# Patient Record
Sex: Female | Born: 1945 | Race: White | Hispanic: No | Marital: Single | Smoking: Never smoker
Health system: Southern US, Community
[De-identification: ages and names within clinical notes are randomized; demographics above are authoritative.]

## PROBLEM LIST (undated history)

## (undated) DIAGNOSIS — C801 Malignant (primary) neoplasm, unspecified: Secondary | ICD-10-CM

## (undated) DIAGNOSIS — Z789 Other specified health status: Secondary | ICD-10-CM

## (undated) DIAGNOSIS — N189 Chronic kidney disease, unspecified: Secondary | ICD-10-CM

## (undated) DIAGNOSIS — I493 Ventricular premature depolarization: Secondary | ICD-10-CM

## (undated) DIAGNOSIS — R112 Nausea with vomiting, unspecified: Secondary | ICD-10-CM

## (undated) DIAGNOSIS — I10 Essential (primary) hypertension: Secondary | ICD-10-CM

## (undated) DIAGNOSIS — M1712 Unilateral primary osteoarthritis, left knee: Secondary | ICD-10-CM

## (undated) DIAGNOSIS — I499 Cardiac arrhythmia, unspecified: Secondary | ICD-10-CM

## (undated) DIAGNOSIS — K219 Gastro-esophageal reflux disease without esophagitis: Secondary | ICD-10-CM

## (undated) DIAGNOSIS — J449 Chronic obstructive pulmonary disease, unspecified: Secondary | ICD-10-CM

## (undated) DIAGNOSIS — J189 Pneumonia, unspecified organism: Secondary | ICD-10-CM

## (undated) HISTORY — PX: CARPAL TUNNEL RELEASE: SHX101

## (undated) HISTORY — DX: Chronic kidney disease, unspecified: N18.9

## (undated) HISTORY — PX: CYSTOSCOPY: SHX5120

## (undated) HISTORY — PX: WRIST ARTHROSCOPY: SUR100

## (undated) HISTORY — DX: Nausea with vomiting, unspecified: R11.2

## (undated) HISTORY — PX: HEMORROIDECTOMY: SUR656

## (undated) HISTORY — PX: ULNAR NERVE REPAIR: SHX2594

## (undated) HISTORY — DX: Essential (primary) hypertension: I10

## (undated) HISTORY — PX: CHOLECYSTECTOMY: SHX55

## (undated) HISTORY — PX: INGUINAL HERNIA REPAIR: SHX194

## (undated) HISTORY — PX: ARCUATE KERATECTOMY: SHX212

## (undated) HISTORY — PX: EYE SURGERY: SHX253

## (undated) HISTORY — PX: JOINT REPLACEMENT: SHX530

## (undated) HISTORY — DX: Other specified health status: Z78.9

## (undated) HISTORY — PX: ABDOMINAL HYSTERECTOMY: SHX81

## (undated) HISTORY — PX: CARDIAC CATHETERIZATION: SHX172

## (undated) HISTORY — DX: Cardiac arrhythmia, unspecified: I49.9

## (undated) HISTORY — DX: Pneumonia, unspecified organism: J18.9

## (undated) HISTORY — PX: BREAST BIOPSY: SHX20

---

## 2010-08-25 ENCOUNTER — Inpatient Hospital Stay (HOSPITAL_COMMUNITY): Admission: RE | Admit: 2010-08-25 | Discharge: 2010-08-28 | Payer: Self-pay | Admitting: Orthopaedic Surgery

## 2011-02-08 LAB — BASIC METABOLIC PANEL
BUN: 11 mg/dL (ref 6–23)
CO2: 30 mEq/L (ref 19–32)
CO2: 31 mEq/L (ref 19–32)
CO2: 33 mEq/L — ABNORMAL HIGH (ref 19–32)
CO2: 30 mEq/L (ref 19–32)
Calcium: 8.3 mg/dL — ABNORMAL LOW (ref 8.4–10.5)
Calcium: 8.5 mg/dL (ref 8.4–10.5)
Calcium: 8.8 mg/dL (ref 8.4–10.5)
Calcium: 9.5 mg/dL (ref 8.4–10.5)
Creatinine, Ser: 0.71 mg/dL (ref 0.4–1.2)
Creatinine, Ser: 0.74 mg/dL (ref 0.4–1.2)
GFR calc Af Amer: >60 mL/min (ref >60)
GFR calc Af Amer: >60 mL/min (ref >60)
GFR calc non Af Amer: >60 mL/min (ref >60)
Glucose, Bld: 172 mg/dL — ABNORMAL HIGH (ref 70–99)
Glucose, Bld: 183 mg/dL — ABNORMAL HIGH (ref 70–99)
Glucose, Bld: 214 mg/dL — ABNORMAL HIGH (ref 70–99)
Sodium: 138 mEq/L (ref 135–145)
Sodium: 140 mEq/L (ref 135–145)
Sodium: 142 mEq/L (ref 135–145)

## 2011-02-08 LAB — CBC
Hemoglobin: 10.5 g/dL — ABNORMAL LOW (ref 12.0–15.0)
Hemoglobin: 10.7 g/dL — ABNORMAL LOW (ref 12.0–15.0)
Hemoglobin: 13.3 g/dL (ref 12.0–15.0)
MCH: 29.4 pg (ref 26.0–34.0)
MCH: 29.9 pg (ref 26.0–34.0)
MCH: 29.9 pg (ref 26.0–34.0)
MCH: 29.4 pg (ref 26.0–34.0)
MCHC: 34 g/dL (ref 30.0–36.0)
MCHC: 34.5 g/dL (ref 30.0–36.0)
MCHC: 34.9 g/dL (ref 30.0–36.0)
Platelets: 267 10*3/uL (ref 150–400)
RBC: 4.51 MIL/uL (ref 3.87–5.11)
RDW: 13.8 % (ref 11.5–15.5)
WBC: 6.8 10*3/uL (ref 4.0–10.5)

## 2011-02-08 LAB — GLUCOSE, CAPILLARY
Glucose-Capillary: 133 mg/dL — ABNORMAL HIGH (ref 70–99)
Glucose-Capillary: 153 mg/dL — ABNORMAL HIGH (ref 70–99)
Glucose-Capillary: 155 mg/dL — ABNORMAL HIGH (ref 70–99)
Glucose-Capillary: 162 mg/dL — ABNORMAL HIGH (ref 70–99)
Glucose-Capillary: 165 mg/dL — ABNORMAL HIGH (ref 70–99)

## 2011-02-08 LAB — PROTIME-INR
INR: 1.03 (ref 0.00–1.49)
Prothrombin Time: 14.4 seconds (ref 11.6–15.2)
Prothrombin Time: 15.7 seconds — ABNORMAL HIGH (ref 11.6–15.2)
Prothrombin Time: 16.9 seconds — ABNORMAL HIGH (ref 11.6–15.2)

## 2011-02-08 LAB — URINALYSIS, ROUTINE W REFLEX MICROSCOPIC
Glucose, UA: NEGATIVE mg/dL
Specific Gravity, Urine: 1.013 (ref 1.005–1.030)
pH: 6.5 (ref 5.0–8.0)

## 2011-09-26 ENCOUNTER — Other Ambulatory Visit: Payer: Self-pay | Admitting: Orthopaedic Surgery

## 2011-09-26 ENCOUNTER — Encounter (HOSPITAL_COMMUNITY): Payer: 59

## 2011-09-26 ENCOUNTER — Ambulatory Visit (HOSPITAL_COMMUNITY)
Admission: RE | Admit: 2011-09-26 | Discharge: 2011-09-26 | Disposition: A | Discharge status: Home / Self Care | Payer: 59 | Source: Ambulatory Visit | Attending: Orthopaedic Surgery | Admitting: Orthopaedic Surgery

## 2011-09-26 ENCOUNTER — Other Ambulatory Visit (HOSPITAL_COMMUNITY): Payer: Self-pay | Admitting: Orthopaedic Surgery

## 2011-09-26 ENCOUNTER — Encounter (HOSPITAL_COMMUNITY): Payer: Self-pay

## 2011-09-26 DIAGNOSIS — Z01818 Encounter for other preprocedural examination: Secondary | ICD-10-CM | POA: Insufficient documentation

## 2011-09-26 DIAGNOSIS — Z01811 Encounter for preprocedural respiratory examination: Secondary | ICD-10-CM

## 2011-09-26 DIAGNOSIS — I10 Essential (primary) hypertension: Secondary | ICD-10-CM | POA: Insufficient documentation

## 2011-09-26 DIAGNOSIS — Z01812 Encounter for preprocedural laboratory examination: Secondary | ICD-10-CM | POA: Insufficient documentation

## 2011-09-26 HISTORY — PX: OTHER SURGICAL HISTORY: SHX169

## 2011-09-26 LAB — URINALYSIS, ROUTINE W REFLEX MICROSCOPIC
Glucose, UA: NEGATIVE mg/dL
Specific Gravity, Urine: 1.018 (ref 1.005–1.030)
pH: 5.5 (ref 5.0–8.0)

## 2011-09-26 LAB — CBC
Hemoglobin: 12.9 g/dL (ref 12.0–15.0)
MCH: 27.9 pg (ref 26.0–34.0)
MCV: 84.4 fL (ref 78.0–100.0)
RBC: 4.63 MIL/uL (ref 3.87–5.11)

## 2011-09-26 LAB — URINE MICROSCOPIC-ADD ON

## 2011-09-26 LAB — BASIC METABOLIC PANEL
CO2: 28 mEq/L (ref 19–32)
Calcium: 10.3 mg/dL (ref 8.4–10.5)
Creatinine, Ser: 0.68 mg/dL (ref 0.50–1.10)
GFR calc Af Amer: >90 mL/min (ref >90)

## 2011-09-26 LAB — SURGICAL PCR SCREEN: MRSA, PCR: NEGATIVE

## 2011-09-26 NOTE — Pre-Procedure Instructions (Addendum)
INST TO [facility] AT SHORT STAY AT 6:30AM ON 10/05/11 NPO AFTER MIDNIGHT -  SHOWER WITH BETASEPT PM AND AM OF SURG  BRING INCENTIVE SPIROMETER TO HOSP  BRING C-PAP MASK 

## 2011-09-27 ENCOUNTER — Encounter (HOSPITAL_COMMUNITY): Payer: Self-pay

## 2011-09-27 NOTE — Pre-Procedure Instructions (Signed)
[facility] TO [facility] AT SHORT STAY AT 6:30AM ON 10/05/11 NPO AFTER MIDNIGHT -  SHOWER WITH BETASEPT PM AND AM OF SURG  BRING INCENTIVE SPIROMETER TO HOSP  BRING C-PAP MASK 

## 2011-09-27 NOTE — Pre-Procedure Instructions (Signed)
INST TO [facility] AT SHORT STAY AT 6:30AM ON 10/05/11 NPO AFTER MIDNIGHT -  SHOWER WITH BETASEPT PM AND AM OF SURG  BRING INCENTIVE SPIROMETER TO HOSP  BRING C-PAP MASK

## 2011-09-27 NOTE — Patient Instructions (Signed)
20 Arwyn Besaw  09/27/2011   Your procedure is scheduled on: 10/05/11 Report to [facility] at 06:30A AM.  Call this number if you have problems the morning of surgery: [phone]   Remember:   Do not eat food:After Midnight.  Do not drink clear liquids: After Midnight.  Take these medicines the morning of surgery with A SIP OF WATER:NONE   Do not wear jewelry, make-up or nail polish.  Do not wear lotions, powders, or perfumes. You may wear deodorant.  Do not shave 48 hours prior to surgery.  Do not bring valuables to the hospital.  Contacts, dentures or bridgework may not be worn into surgery.  Leave suitcase in the car. After surgery it may be brought to your room.  For patients admitted to the hospital, checkout time is 11:00 AM the day of discharge.   Patients discharged the day of surgery will not be allowed to drive home.  Name and phone number of your driver:  Special Instructions: Incentive Spirometry - Practice and bring it with you on the day of surgery.   Please read over the following fact sheets that you were given: Blood Transfusion Information, MRSA Information and Surgical Site Infection Prevention

## 2011-10-02 NOTE — H&P (Signed)
Lindsey Williams, Lindsey Williams                  ACCOUNT NO.:  [id number]  MEDICAL RECORD NO.:  [id number]  LOCATION:                                 FACILITY:  PHYSICIAN:  Vanita Panda. Magnus Ivan, M.D.DATE OF BIRTH:  1945/12/03  DATE OF ADMISSION:  10/05/2011 DATE OF DISCHARGE:                             HISTORY & PHYSICAL   CHIEF COMPLAINT:  Severe left knee pain.  HISTORY OF PRESENT ILLNESS:  Ms. Pence is a patient that is well known to me.  She has well documented severe osteoarthritis of her left knee. This knee has been hurting quite a bit for the last year.  She is actually status post right total knee arthroplasty around a year ago. The right knee has been doing well, but the left knee has gotten to where it is affecting her activities of daily living including walking. She cannot walk any significant distances without assistive device and with well documented evidence of bone on bone wear.  She wished to proceed at this point with a total knee arthroplasty.  She has even had to modify her work in terms of how she sits.  She has tried multiple steroid injections in her left knee as well as anti-inflammatories and rest.  At this point, we are going to proceed with a total knee arthroplasty of the left knee.  She understands the risks of acute blood loss anemia, nerve and vessel injury, DVT and PE as well as even death. She understands the benefits of the improved quality of life and decrease pain and that is what our goal of surgery would be is to give her reasonably improved quality of life and decrease the pain.  She is scheduled for surgery on Friday, October 05, 2011.  PAST MEDICAL HISTORY: 1. High blood pressure. 2. Heart disease. 3. Osteoarthritis. 4. Bladder problems. 5. Sleep apnea. 6. Remote history of cancer.  MEDICATIONS: 1. Tylenol. 2. Aspirin. 3. Vitamin B complex with vitamin C. 4. Os-Cal with vitamin D. 5. Celebrex. 6. Metoprolol. 7.  Diovan/hydrochlorothiazide. 8. We will review her home medications and home medical reconciliation     orders on the day of surgery.  ALLERGIES:  ERYTHROMYCIN.  FAMILY MEDICAL HISTORY:  Diabetes, heart disease, kidney and bladder cancer, and high blood pressure.  SOCIAL HISTORY:  She works as a Holiday representative.  She does not smoke and does not drink regularly.  REVIEW OF SYSTEMS:  Negative for chest pain, shortness of breath, fever, chills, nausea, or vomiting.  Positive for left knee pain.  PHYSICAL EXAMINATION:  VITAL SIGNS:  She is afebrile with stable vital signs and her updated vitals signs will be seen and recorded on the day of surgery. GENERAL:  She is alert and oriented x3, in no acute distress. HEENT:  Normocephalic and atraumatic.  Pupils equal, round, and reactive to light.  Extraocular muscles intact. NECK:  Supple.  No JVD.  No bruits. LUNGS:  Clear to auscultation bilaterally. HEART:  Regular rate and rhythm. ABDOMEN:  Soft, nontender, and nondistended with normal bowel sounds. EXTREMITIES:  Left knee shows patellofemoral crepitation and pain with throughout her range of motion of her knee.  There is a  varus deformity of the knee as well and she is neurovascularly intact.  X-rays confirm end-stage arthritis of her left knee with bone on bone wear and complete loss of joint space.  ASSESSMENT:  This is a 65 year old female with end-stage arthritis of her left knee.  PLAN:  We will proceed with left total knee arthroplasty and then she will be admitted as an inpatient following surgery.     Vanita Panda. Magnus Ivan, M.D.     CYB/MEDQ  D:  10/02/2011  T:  10/02/2011  Job:  [id number]

## 2011-10-05 ENCOUNTER — Inpatient Hospital Stay (HOSPITAL_COMMUNITY): Payer: 59

## 2011-10-05 ENCOUNTER — Inpatient Hospital Stay (HOSPITAL_COMMUNITY): Payer: 59 | Admitting: Anesthesiology

## 2011-10-05 ENCOUNTER — Encounter (HOSPITAL_COMMUNITY)
Admission: RE | Disposition: A | Discharge status: Home Health | Payer: Self-pay | Source: Ambulatory Visit | Attending: Orthopaedic Surgery

## 2011-10-05 ENCOUNTER — Encounter (HOSPITAL_COMMUNITY): Payer: Self-pay | Admitting: Anesthesiology

## 2011-10-05 ENCOUNTER — Encounter (HOSPITAL_COMMUNITY): Payer: Self-pay | Admitting: *Deleted

## 2011-10-05 ENCOUNTER — Inpatient Hospital Stay (HOSPITAL_COMMUNITY)
Admission: RE | Admit: 2011-10-05 | Discharge: 2011-10-09 | DRG: 470 | Disposition: A | Discharge status: Home Health | Payer: 59 | Source: Ambulatory Visit | Attending: Orthopaedic Surgery | Admitting: Orthopaedic Surgery

## 2011-10-05 DIAGNOSIS — Z7982 Long term (current) use of aspirin: Secondary | ICD-10-CM

## 2011-10-05 DIAGNOSIS — Z6837 Body mass index (BMI) 37.0-37.9, adult: Secondary | ICD-10-CM

## 2011-10-05 DIAGNOSIS — Z01812 Encounter for preprocedural laboratory examination: Secondary | ICD-10-CM

## 2011-10-05 DIAGNOSIS — I10 Essential (primary) hypertension: Secondary | ICD-10-CM | POA: Diagnosis present

## 2011-10-05 DIAGNOSIS — M1710 Unilateral primary osteoarthritis, unspecified knee: Principal | ICD-10-CM | POA: Diagnosis present

## 2011-10-05 DIAGNOSIS — I519 Heart disease, unspecified: Secondary | ICD-10-CM | POA: Diagnosis present

## 2011-10-05 DIAGNOSIS — Z96659 Presence of unspecified artificial knee joint: Secondary | ICD-10-CM

## 2011-10-05 DIAGNOSIS — I499 Cardiac arrhythmia, unspecified: Secondary | ICD-10-CM | POA: Diagnosis present

## 2011-10-05 DIAGNOSIS — G4730 Sleep apnea, unspecified: Secondary | ICD-10-CM | POA: Diagnosis present

## 2011-10-05 DIAGNOSIS — M1712 Unilateral primary osteoarthritis, left knee: Secondary | ICD-10-CM | POA: Diagnosis present

## 2011-10-05 DIAGNOSIS — R7309 Other abnormal glucose: Secondary | ICD-10-CM | POA: Diagnosis present

## 2011-10-05 HISTORY — PX: KNEE ARTHROPLASTY: SHX992

## 2011-10-05 HISTORY — DX: Unilateral primary osteoarthritis, left knee: M17.12

## 2011-10-05 LAB — GLUCOSE, CAPILLARY

## 2011-10-05 LAB — TYPE AND SCREEN

## 2011-10-05 LAB — ABO/RH: ABO/RH(D): O POS

## 2011-10-05 SURGERY — ARTHROPLASTY, KNEE, TOTAL, USING IMAGELESS COMPUTER-ASSISTED NAVIGATION
Anesthesia: General | Site: Knee | Laterality: Left | Wound class: Clean

## 2011-10-05 MED ORDER — METOCLOPRAMIDE HCL 5 MG/ML IJ SOLN
5.0000 mg | Freq: Three times a day (TID) | INTRAMUSCULAR | Status: DC | PRN
  Administered 2011-10-05: 10 mg via INTRAVENOUS
  Filled 2011-10-05: qty 2

## 2011-10-05 MED ORDER — OXYCODONE HCL 5 MG PO TABS
5.0000 mg | ORAL_TABLET | ORAL | Status: DC | PRN
  Administered 2011-10-06 – 2011-10-07 (×6): 5 mg via ORAL
  Administered 2011-10-08 – 2011-10-09 (×5): 10 mg via ORAL
  Filled 2011-10-05 (×2): qty 2
  Filled 2011-10-05 (×2): qty 1
  Filled 2011-10-05 (×2): qty 2
  Filled 2011-10-05: qty 1
  Filled 2011-10-05: qty 2
  Filled 2011-10-05: qty 1
  Filled 2011-10-05: qty 2
  Filled 2011-10-05 (×2): qty 1

## 2011-10-05 MED ORDER — ACETAMINOPHEN 10 MG/ML IV SOLN
INTRAVENOUS | Status: DC | PRN
  Administered 2011-10-05: 1000 mg via INTRAVENOUS

## 2011-10-05 MED ORDER — SODIUM CHLORIDE 0.9 % IV SOLN
INTRAVENOUS | Status: DC
  Administered 2011-10-05 – 2011-10-07 (×4): via INTRAVENOUS

## 2011-10-05 MED ORDER — MAGNESIUM HYDROXIDE 400 MG/5ML PO SUSP: 30.0000 mL | Freq: Two times a day (BID) | ORAL | Status: DC | PRN

## 2011-10-05 MED ORDER — SODIUM CHLORIDE 0.9 % IR SOLN
Status: DC | PRN
  Administered 2011-10-05: 1000 mL

## 2011-10-05 MED ORDER — FLEET ENEMA 7-19 GM/118ML RE ENEM: 1.0000 | ENEMA | Freq: Every day | RECTAL | Status: DC | PRN

## 2011-10-05 MED ORDER — DOCUSATE SODIUM 100 MG PO CAPS
100.0000 mg | ORAL_CAPSULE | Freq: Two times a day (BID) | ORAL | Status: DC
  Administered 2011-10-05 – 2011-10-09 (×8): 100 mg via ORAL
  Filled 2011-10-05 (×11): qty 1

## 2011-10-05 MED ORDER — ACETAMINOPHEN 325 MG PO TABS
650.0000 mg | ORAL_TABLET | Freq: Four times a day (QID) | ORAL | Status: DC | PRN
  Administered 2011-10-07: 650 mg via ORAL
  Filled 2011-10-05: qty 2

## 2011-10-05 MED ORDER — MEPERIDINE HCL 25 MG/ML IJ SOLN: 6.2500 mg | INTRAMUSCULAR | Status: DC | PRN

## 2011-10-05 MED ORDER — MIDAZOLAM HCL 5 MG/5ML IJ SOLN
INTRAMUSCULAR | Status: DC | PRN
  Administered 2011-10-05 (×2): 1 mg via INTRAVENOUS

## 2011-10-05 MED ORDER — HYDROMORPHONE HCL PF 1 MG/ML IJ SOLN
0.2500 mg | INTRAMUSCULAR | Status: DC | PRN
  Administered 2011-10-05: 0.5 mg via INTRAVENOUS
  Administered 2011-10-05: 0.25 mg via INTRAVENOUS
  Administered 2011-10-05: 0.5 mg via INTRAVENOUS
  Administered 2011-10-05: 0.25 mg via INTRAVENOUS

## 2011-10-05 MED ORDER — OLMESARTAN MEDOXOMIL 40 MG PO TABS
40.0000 mg | ORAL_TABLET | Freq: Every day | ORAL | Status: DC
  Administered 2011-10-06 – 2011-10-09 (×4): 40 mg via ORAL
  Filled 2011-10-05 (×6): qty 1

## 2011-10-05 MED ORDER — B COMPLEX-C PO TABS
1.0000 | ORAL_TABLET | Freq: Every day | ORAL | Status: DC
  Administered 2011-10-06 – 2011-10-09 (×4): 1 via ORAL
  Filled 2011-10-05 (×6): qty 1

## 2011-10-05 MED ORDER — ONDANSETRON HCL 4 MG PO TABS: 4.0000 mg | ORAL_TABLET | Freq: Four times a day (QID) | ORAL | Status: DC | PRN

## 2011-10-05 MED ORDER — METOCLOPRAMIDE HCL 10 MG PO TABS
5.0000 mg | ORAL_TABLET | Freq: Three times a day (TID) | ORAL | Status: DC | PRN
  Administered 2011-10-08: 10 mg via ORAL
  Filled 2011-10-05: qty 2

## 2011-10-05 MED ORDER — BISACODYL 10 MG RE SUPP: 10.0000 mg | Freq: Every day | RECTAL | Status: DC | PRN

## 2011-10-05 MED ORDER — SUCCINYLCHOLINE CHLORIDE 20 MG/ML IJ SOLN
INTRAMUSCULAR | Status: DC | PRN
  Administered 2011-10-05: 100 mg via INTRAVENOUS

## 2011-10-05 MED ORDER — RIVAROXABAN 10 MG PO TABS: 10.0000 mg | ORAL_TABLET | ORAL | Status: DC

## 2011-10-05 MED ORDER — MORPHINE SULFATE (PF) 1 MG/ML IV SOLN
INTRAVENOUS | Status: DC
  Administered 2011-10-05: 21:00:00 via INTRAVENOUS
  Administered 2011-10-06: 9 mg via INTRAVENOUS
  Administered 2011-10-06: 16.5 mg via INTRAVENOUS
  Administered 2011-10-06 (×3): 19.5 mg via INTRAVENOUS
  Administered 2011-10-06: 01:00:00 via INTRAVENOUS
  Administered 2011-10-06: 10.5 mg via INTRAVENOUS
  Administered 2011-10-07: 7.21 mg via INTRAVENOUS
  Administered 2011-10-07: 4.5 mg via INTRAVENOUS
  Administered 2011-10-07: 7.5 mg via INTRAVENOUS
  Filled 2011-10-05 (×4): qty 30

## 2011-10-05 MED ORDER — ROPIVACAINE HCL 5 MG/ML IJ SOLN
INTRAMUSCULAR | Status: DC | PRN
  Administered 2011-10-05: 30 mL via EPIDURAL

## 2011-10-05 MED ORDER — BISACODYL 5 MG PO TBEC: 10.0000 mg | DELAYED_RELEASE_TABLET | Freq: Every day | ORAL | Status: DC | PRN

## 2011-10-05 MED ORDER — VALSARTAN-HYDROCHLOROTHIAZIDE 320-12.5 MG PO TABS: 1.0000 | ORAL_TABLET | Freq: Every evening | ORAL | Status: DC

## 2011-10-05 MED ORDER — PROMETHAZINE HCL 25 MG/ML IJ SOLN: 6.2500 mg | INTRAMUSCULAR | Status: DC | PRN

## 2011-10-05 MED ORDER — ONDANSETRON HCL 4 MG/2ML IJ SOLN
4.0000 mg | Freq: Four times a day (QID) | INTRAMUSCULAR | Status: DC | PRN
  Administered 2011-10-05: 4 mg via INTRAVENOUS
  Filled 2011-10-05 (×3): qty 2

## 2011-10-05 MED ORDER — SODIUM CHLORIDE 0.9 % IR SOLN
Status: DC | PRN
  Administered 2011-10-05: 3000 mL

## 2011-10-05 MED ORDER — HYDROMORPHONE 0.3 MG/ML IV SOLN
INTRAVENOUS | Status: DC
  Administered 2011-10-05: 0.3 mg via INTRAVENOUS
  Filled 2011-10-05: qty 25

## 2011-10-05 MED ORDER — DIPHENHYDRAMINE HCL 50 MG/ML IJ SOLN
12.5000 mg | Freq: Four times a day (QID) | INTRAMUSCULAR | Status: DC | PRN
  Administered 2011-10-06: 12.5 mg via INTRAVENOUS
  Filled 2011-10-05: qty 1

## 2011-10-05 MED ORDER — PHENOL 1.4 % MT LIQD
1.0000 | OROMUCOSAL | Status: DC | PRN
  Filled 2011-10-05: qty 177

## 2011-10-05 MED ORDER — SCOPOLAMINE 1 MG/3DAYS TD PT72
MEDICATED_PATCH | TRANSDERMAL | Status: DC | PRN
  Administered 2011-10-05: 1 via TRANSDERMAL

## 2011-10-05 MED ORDER — METOPROLOL SUCCINATE ER 50 MG PO TB24
50.0000 mg | ORAL_TABLET | Freq: Every day | ORAL | Status: DC
  Administered 2011-10-05 – 2011-10-08 (×4): 50 mg via ORAL
  Filled 2011-10-05 (×5): qty 1

## 2011-10-05 MED ORDER — LACTATED RINGERS IV SOLN
INTRAVENOUS | Status: DC | PRN
  Administered 2011-10-05 (×3): via INTRAVENOUS

## 2011-10-05 MED ORDER — RIVAROXABAN 10 MG PO TABS
10.0000 mg | ORAL_TABLET | Freq: Every day | ORAL | Status: DC
  Administered 2011-10-06 – 2011-10-09 (×4): 10 mg via ORAL
  Filled 2011-10-05 (×4): qty 1

## 2011-10-05 MED ORDER — MORPHINE SULFATE 2 MG/ML IJ SOLN: 1.0000 mg | INTRAMUSCULAR | Status: DC | PRN

## 2011-10-05 MED ORDER — LIDOCAINE HCL (PF) 2 % IJ SOLN
INTRAMUSCULAR | Status: DC | PRN
  Administered 2011-10-05: 50 mL

## 2011-10-05 MED ORDER — POLYETHYLENE GLYCOL 3350 17 G PO PACK
17.0000 g | PACK | Freq: Every day | ORAL | Status: DC | PRN
  Filled 2011-10-05: qty 1

## 2011-10-05 MED ORDER — HYDROCODONE-ACETAMINOPHEN 5-325 MG PO TABS: 1.0000 | ORAL_TABLET | ORAL | Status: DC | PRN

## 2011-10-05 MED ORDER — FENTANYL CITRATE 0.05 MG/ML IJ SOLN
INTRAMUSCULAR | Status: DC | PRN
  Administered 2011-10-05 (×9): 50 ug via INTRAVENOUS

## 2011-10-05 MED ORDER — METOPROLOL SUCCINATE ER 50 MG PO TB24
50.0000 mg | ORAL_TABLET | Freq: Every evening | ORAL | Status: DC
  Filled 2011-10-05 (×2): qty 1

## 2011-10-05 MED ORDER — DEXAMETHASONE SODIUM PHOSPHATE 10 MG/ML IJ SOLN
INTRAMUSCULAR | Status: DC | PRN
  Administered 2011-10-05: 10 mg via INTRAVENOUS

## 2011-10-05 MED ORDER — VITAMIN D3 25 MCG (1000 UNIT) PO TABS
1000.0000 [IU] | ORAL_TABLET | Freq: Every day | ORAL | Status: DC
  Administered 2011-10-06 – 2011-10-09 (×4): 1000 [IU] via ORAL
  Filled 2011-10-05 (×5): qty 1

## 2011-10-05 MED ORDER — HYDROCHLOROTHIAZIDE 12.5 MG PO CAPS
12.5000 mg | ORAL_CAPSULE | Freq: Every day | ORAL | Status: DC
  Administered 2011-10-06 – 2011-10-09 (×4): 12.5 mg via ORAL
  Filled 2011-10-05 (×6): qty 1

## 2011-10-05 MED ORDER — NALOXONE HCL 0.4 MG/ML IJ SOLN: 0.4000 mg | INTRAMUSCULAR | Status: DC | PRN

## 2011-10-05 MED ORDER — EPHEDRINE SULFATE 50 MG/ML IJ SOLN
INTRAMUSCULAR | Status: DC | PRN
  Administered 2011-10-05: 10 mg via INTRAVENOUS

## 2011-10-05 MED ORDER — DIPHENHYDRAMINE HCL 12.5 MG/5ML PO ELIX
12.5000 mg | ORAL_SOLUTION | Freq: Four times a day (QID) | ORAL | Status: DC | PRN
  Administered 2011-10-06 – 2011-10-07 (×3): 12.5 mg via ORAL
  Filled 2011-10-05 (×3): qty 5

## 2011-10-05 MED ORDER — ACETAMINOPHEN 650 MG RE SUPP: 650.0000 mg | Freq: Four times a day (QID) | RECTAL | Status: DC | PRN

## 2011-10-05 MED ORDER — HYDROMORPHONE 0.3 MG/ML IV SOLN
INTRAVENOUS | Status: DC
  Administered 2011-10-05: 1.8 mg via INTRAVENOUS
  Administered 2011-10-05: 2.7 mL via INTRAVENOUS

## 2011-10-05 MED ORDER — CALCIUM CARBONATE-VITAMIN D 500-200 MG-UNIT PO TABS
0.5000 | ORAL_TABLET | Freq: Every day | ORAL | Status: DC
  Filled 2011-10-05 (×2): qty 0.5

## 2011-10-05 MED ORDER — CEFAZOLIN SODIUM-DEXTROSE 2-3 GM-% IV SOLR
2.0000 g | INTRAVENOUS | Status: AC
  Administered 2011-10-05: 2 g via INTRAVENOUS
  Filled 2011-10-05: qty 50

## 2011-10-05 MED ORDER — PROPOFOL 10 MG/ML IV EMUL
INTRAVENOUS | Status: DC | PRN
  Administered 2011-10-05: 180 mg via INTRAVENOUS

## 2011-10-05 MED ORDER — SODIUM CHLORIDE 0.9 % IJ SOLN: 9.0000 mL | INTRAMUSCULAR | Status: DC | PRN

## 2011-10-05 MED ORDER — ONDANSETRON HCL 4 MG/2ML IJ SOLN
INTRAMUSCULAR | Status: DC | PRN
  Administered 2011-10-05: 4 mg via INTRAVENOUS

## 2011-10-05 MED ORDER — MENTHOL 3 MG MT LOZG
1.0000 | LOZENGE | OROMUCOSAL | Status: DC | PRN
  Filled 2011-10-05: qty 9

## 2011-10-05 MED ORDER — CEFAZOLIN SODIUM-DEXTROSE 2-3 GM-% IV SOLR
2.0000 g | Freq: Four times a day (QID) | INTRAVENOUS | Status: AC
  Administered 2011-10-05 – 2011-10-06 (×3): 2 g via INTRAVENOUS
  Filled 2011-10-05 (×3): qty 50

## 2011-10-05 MED ORDER — ONDANSETRON HCL 4 MG/2ML IJ SOLN
4.0000 mg | Freq: Four times a day (QID) | INTRAMUSCULAR | Status: DC | PRN
  Administered 2011-10-06 – 2011-10-08 (×3): 4 mg via INTRAVENOUS
  Filled 2011-10-05: qty 2

## 2011-10-05 MED ORDER — CALCIUM CARBONATE-VITAMIN D 250-125 MG-UNIT PO TABS
1.0000 | ORAL_TABLET | Freq: Every day | ORAL | Status: DC
  Administered 2011-10-05 – 2011-10-08 (×3): 1 via ORAL
  Filled 2011-10-05 (×10): qty 1

## 2011-10-05 SURGICAL SUPPLY — 55 items
BAG ZIPLOCK 12X15 (MISCELLANEOUS) ×2 IMPLANT
BANDAGE ELASTIC 6 VELCRO ST LF (GAUZE/BANDAGES/DRESSINGS) ×2 IMPLANT
BANDAGE ESMARK 6X9 LF (GAUZE/BANDAGES/DRESSINGS) ×1 IMPLANT
BLADE SAG 18X100X1.27 (BLADE) ×2 IMPLANT
BLADE SAW SGTL 13.0X1.19X90.0M (BLADE) ×2 IMPLANT
BLADE SURG SZ11 CARB STEEL (BLADE) ×2 IMPLANT
BNDG ESMARK 6X9 LF (GAUZE/BANDAGES/DRESSINGS) ×2
BOWL SMART MIX CTS (DISPOSABLE) ×2 IMPLANT
CATH FOLEY LATEX FREE 16FR (CATHETERS) ×2 IMPLANT
CEMENT HV SMART SET (Cement) ×4 IMPLANT
CLOTH BEACON ORANGE TIMEOUT ST (SAFETY) ×2 IMPLANT
COVER SURGICAL LIGHT HANDLE (MISCELLANEOUS) IMPLANT
CUFF TOURN SGL QUICK 34 (TOURNIQUET CUFF) ×1
CUFF TRNQT CYL 34X4X40X1 (TOURNIQUET CUFF) ×1 IMPLANT
DRAPE EXTREMITY T 121X128X90 (DRAPE) ×2 IMPLANT
DRAPE LG THREE QUARTER DISP (DRAPES) ×2 IMPLANT
DRAPE POUCH INSTRU U-SHP 10X18 (DRAPES) ×2 IMPLANT
DRAPE U-SHAPE 47X51 STRL (DRAPES) ×2 IMPLANT
DRSG PAD ABDOMINAL 8X10 ST (GAUZE/BANDAGES/DRESSINGS) ×2 IMPLANT
DURAPREP 26ML APPLICATOR (WOUND CARE) ×2 IMPLANT
ELECT REM PT RETURN 9FT ADLT (ELECTROSURGICAL) ×2
ELECTRODE REM PT RTRN 9FT ADLT (ELECTROSURGICAL) ×1 IMPLANT
EVACUATOR 1/8 PVC DRAIN (DRAIN) ×2 IMPLANT
FACESHIELD LNG OPTICON STERILE (SAFETY) ×10 IMPLANT
GAUZE SPONGE 4X4 12PLY STRL LF (GAUZE/BANDAGES/DRESSINGS) ×2 IMPLANT
GAUZE XEROFORM 5X9 LF (GAUZE/BANDAGES/DRESSINGS) ×2 IMPLANT
GLOVE BIO SURGEON STRL SZ7.5 (GLOVE) ×2 IMPLANT
GOWN STRL REIN XL XLG (GOWN DISPOSABLE) ×2 IMPLANT
HANDPIECE INTERPULSE COAX TIP (DISPOSABLE) ×1
IMMOBILIZER KNEE 20 (SOFTGOODS) ×2
IMMOBILIZER KNEE 20 THIGH 36 (SOFTGOODS) ×1 IMPLANT
KIT BASIN OR (CUSTOM PROCEDURE TRAY) ×2 IMPLANT
MARKER SPHERE PSV REFLC THRD 5 (MARKER) ×10 IMPLANT
NS IRRIG 1000ML POUR BTL (IV SOLUTION) ×2 IMPLANT
PACK TOTAL JOINT (CUSTOM PROCEDURE TRAY) ×2 IMPLANT
PADDING WEBRIL 6 STERILE (GAUZE/BANDAGES/DRESSINGS) ×2 IMPLANT
PIN SCHANZ 4MM 130MM (PIN) ×8 IMPLANT
POSITIONER SURGICAL ARM (MISCELLANEOUS) ×2 IMPLANT
SET HNDPC FAN SPRY TIP SCT (DISPOSABLE) ×1 IMPLANT
SET PAD KNEE POSITIONER (MISCELLANEOUS) ×2 IMPLANT
SPONGE LAP 18X18 X RAY DECT (DISPOSABLE) ×2 IMPLANT
STAPLER VISISTAT 35W (STAPLE) ×2 IMPLANT
STRIP CLOSURE SKIN 1/2X4 (GAUZE/BANDAGES/DRESSINGS) IMPLANT
SUCTION FRAZIER 12FR DISP (SUCTIONS) ×2 IMPLANT
SUT ETHILON 3 0 PS 1 (SUTURE) ×2 IMPLANT
SUT VIC AB 0 CT1 27 (SUTURE)
SUT VIC AB 0 CT1 27XBRD ANTBC (SUTURE) IMPLANT
SUT VIC AB 1 CT1 27 (SUTURE) ×3
SUT VIC AB 1 CT1 27XBRD ANTBC (SUTURE) ×3 IMPLANT
SUT VIC AB 2-0 CT1 27 (SUTURE) ×3
SUT VIC AB 2-0 CT1 TAPERPNT 27 (SUTURE) ×3 IMPLANT
SUT VIC AB 4-0 PS2 27 (SUTURE) IMPLANT
TOWEL OR 17X26 10 PK STRL BLUE (TOWEL DISPOSABLE) ×4 IMPLANT
WATER STERILE IRR 1500ML POUR (IV SOLUTION) ×2 IMPLANT
WRAP KNEE MAXI GEL POST OP (GAUZE/BANDAGES/DRESSINGS) ×2 IMPLANT

## 2011-10-05 NOTE — Anesthesia Procedure Notes (Addendum)
Anesthesia Regional Block:  Femoral nerve block  Pre-Anesthetic Checklist: ,, timeout performed, Correct Patient, Correct Site, Correct Laterality, Correct Procedure, Correct Position, site marked, Risks and benefits discussed,  Surgical consent,  Pre-op evaluation,  At surgeon's request and post-op pain management  Laterality: Left  Prep: chloraprep       Needles:  Injection technique: Single-shot  Needle Type: Stimiplex          Additional Needles:  Procedures: ultrasound guided Femoral nerve block Narrative:   Performed by: Personally   Additional Notes: Patient tolerated the procedure well without complications  Femoral nerve block Procedure Name: Intubation Performed by: Yesly Gerety, Ricki Rodriguez Pre-anesthesia Checklist: Patient identified, Suction available, Patient being monitored, Emergency Drugs available and Timeout performed Patient Re-evaluated:Patient Re-evaluated prior to inductionOxygen Delivery Method: Circle System Utilized and Simple face mask Preoxygenation: Pre-oxygenation with 100% oxygen Intubation Type: IV induction Ventilation: Mask ventilation without difficulty Laryngoscope Size: Mac and 4 Grade View: Grade II Tube type: Oral Tube size: 7.5 mm Placement Confirmation: positive ETCO2,  ETT inserted through vocal cords under direct vision and breath sounds checked- equal and bilateral Secured at: 22 cm Tube secured with: Tape Dental Injury: Teeth and Oropharynx as per pre-operative assessment

## 2011-10-05 NOTE — Addendum Note (Signed)
Addendum  created 10/05/11 1201 by Phillips Grout, MD   Modules edited:Notes Section

## 2011-10-05 NOTE — Anesthesia Postprocedure Evaluation (Signed)
  Anesthesia Post-op Note  [name]  Procedure(s) Performed:  COMPUTER ASSISTED TOTAL KNEE ARTHROPLASTY - preop femoral nerve block  Patient Location: PACU  Anesthesia Type: General and GA combined with regional for post-op pain  Level of Consciousness: awake and alert   Airway and Oxygen Therapy: Patient Spontanous Breathing  Post-op Pain: mild  Post-op Assessment: Post-op Vital signs reviewed, Patient's Cardiovascular Status Stable, Respiratory Function Stable, Patent Airway and No signs of Nausea or vomiting  Post-op Vital Signs: stable  Complications: No apparent anesthesia complications

## 2011-10-05 NOTE — Op Note (Signed)
Lindsey Williams, Lindsey Williams                  ACCOUNT NO.:  [id number]  MEDICAL RECORD NO.:  [id number]  LOCATION:  [facility]                         FACILITY:  [facility]  PHYSICIAN:  Vanita Panda. Magnus Ivan, M.D.DATE OF BIRTH:  Apr 25, 1946  DATE OF PROCEDURE:  10/05/2011 DATE OF DISCHARGE:                              OPERATIVE REPORT   PREOPERATIVE DIAGNOSIS:  Severe osteoarthritis and degenerative joint disease, left knee.  POSTOPERATIVE DIAGNOSIS:  Severe osteoarthritis and degenerative joint disease, left knee.  PROCEDURE:  Left total knee arthroplasty utilizing computer navigation as assistance.  IMPLANTS:  DePuy rotating platform, PFC Sigma Knee with size 2.5 femur, size 2 tibial tray, 32-mm patellar button, 15-mm polyethylene insert.  SURGEON:  Vanita Panda. Magnus Ivan, M.D.  ASSISTANT:  Wende Neighbors, PA-C who was present and assisted in the entirety of the case and whose assistance was integral for the case.  ANESTHESIA: 1. Regional left femoral block. 2. General.  ANTIBIOTICS:  2 g IV Ancef.  TOURNIQUET TIME:  Around 79 minutes.  BLOOD LOSS:  100 cc.  COMPLICATIONS:  None.  INDICATIONS:  Ms. Karr is a 65 year old female with severe debilitating arthritis of her left knee.  She has tried injections in her knee, anti- inflammatories, rest and she walks with assisted device on occasion. She has had a previous right total knee arthroplasty that went well.  At this point, she wished to proceed with a left total knee arthroplasty. The risks and benefits of surgery explained to her in detail and she does wish to proceed with surgery.  PROCEDURE DESCRIPTION:  After informed consent was obtained, appropriate left knee was marked, anesthesia obtained through regional femoral block, she was then brought to the operating room, placed on the operating table.  General anesthesia was then obtained.  A nonsterile tourniquet was placed around her upper left thigh and her leg was prepped  and draped with DuraPrep and sterile drapes including sterile stockinette.  A time-out was called to identify correct patient, correct left knee.  I then used an Esmarch wrap out the leg and the tourniquet was inflated to 300 mL of pressure.  A midline incision was then made and carried down to the knee joint and a medial parapatellar arthrotomy was performed.  I then was able to clean the knee of osteophytes and debris including remnants of the PCL and ACL and found a severely arthritic knee with bone-on-bone wear throughout all 3 compartments of the knee.  I next proceeded with the computer navigation portion of the case.  After temporary guide pins were placed in the femur and the tibia, small globes were placed on these and using an infrared camera down the table on a computer model, we were able to generate a model of the knee to help [place] with precision of cutting the knee.  First, we took a 10-mm off the high side of the tibia on her tibial cut.  We then balanced the knee with her soft tissue tensors in flexion and extension.  We made our distal femoral cut and chose for 2.5 femur, so we took our rotation guide and then made our chamfer cuts and anterior and posterior cuts.  Next,  we turned attention back to the tibia.  Debris was cleaned from behind the knee and then we placed a trial 2, size 2 tibial component.  We drilled for the post and the peg and we placed the trial tibia combined with the trial femur, we trialed up to several different polyethylenes up to 15 and the 15 was felt to be more stable.  We then drilled lugs for patellar button and placed the patellar button as well.  We then removed all trial instrumentation, copiously irrigated the knee with normal saline solution.  I removed all of the computer navigation pins in accordance with the computer plan. We were pleased with balance of the knee.  Then after copiously irrigating the knee, we took out all trial implants  and then cemented the real size 2.5 femur followed by the size 2 tibial tray.  We placed the real 15 polyethylene liner and cemented the patellar button.  Once the cement had hardened and dried, tourniquet was let down.  Hemostasis was obtained with electrocautery.  We placed a medium Hemovac drain and then I noticed there was a retinacular tear on the lateral aspect of the patellar tendon, which I repaired with #1 Vicryl suture.  I then replaced the medium Hemovac drain.  I reapproximated the arthrotomy with interrupted #1 Vicryl followed by 2-0 Vicryl in the  subcutaneous tissue and staples on the skin.  Xeroform followed by well-padded sterile dressing was applied.  The patient was awakened, extubated, and taken to recovery room in stable condition.  All final counts were correct. There were no complications noted.  Of note, Maud Deed, PA-C participated in the enterity of the case and her assistance was definitely needed to frozen the obese individual.     Vanita Panda. Magnus Ivan, M.D.     CYB/MEDQ  D:  10/05/2011  T:  10/05/2011  Job:  [id number]

## 2011-10-05 NOTE — Anesthesia Postprocedure Evaluation (Signed)
  Anesthesia Post-op Note  [name]  Procedure(s) Performed:  COMPUTER ASSISTED TOTAL KNEE ARTHROPLASTY - preop femoral nerve block  Patient Location: PACU  Anesthesia Type: General  Level of Consciousness: awake and alert   Airway and Oxygen Therapy: Patient Spontanous Breathing  Post-op Pain: mild  Post-op Assessment: Post-op Vital signs reviewed, Patient's Cardiovascular Status Stable, Respiratory Function Stable, Patent Airway and No signs of Nausea or vomiting  Post-op Vital Signs: stable  Complications: No apparent anesthesia complications

## 2011-10-05 NOTE — Progress Notes (Signed)
  See H&P.  No change.  Seen and examined patient.  To OR today for left total knee replacement.

## 2011-10-05 NOTE — Brief Op Note (Signed)
10/05/2011  10:43 AM  PATIENT:  Lindsey Williams  65 y.o. female  PRE-OPERATIVE DIAGNOSIS:  severe osteoarthritis,, degenerative joint disease left knee  POST-OPERATIVE DIAGNOSIS:  severe osteoarthritis, degenerative joint disease left knee  PROCEDURE:  Procedure(s): COMPUTER ASSISTED TOTAL KNEE ARTHROPLASTY  SURGEON:  Surgeon(s): Kathryne Hitch  PHYSICIAN ASSISTANT: Jodene Nam PA-C   ANESTHESIA:   regional and general  EBL:  Total I/O In: 2000 [I.V.:2000] Out: 300 [Urine:200; Blood:100]  BLOOD ADMINISTERED:none   TOURNIQUET:   Total Tourniquet Time Documented: Thigh (Left) - 79 minutes  DICTATION: .Other Dictation: Dictation Number [id number]   PATIENT DISPOSITION:  PACU - hemodynamically stable.   Delay start of Pharmacological VTE agent (>24hrs) due to surgical blood loss or risk of bleeding:  {YES/NO/NOT APPLICABLE:20182

## 2011-10-05 NOTE — Anesthesia Preprocedure Evaluation (Addendum)
Anesthesia Evaluation  Patient identified by MRN, date of birth, ID band Patient awake    Reviewed: Allergy & Precautions, H&P , NPO status , Patient's Chart, lab work & pertinent test results  History of Anesthesia Complications (+) PONV  Airway Mallampati: III TM Distance: >3 FB Neck ROM: Full    Dental No notable dental hx. (+)  Crowns and bridges :   Pulmonary neg pulmonary ROS,  clear to auscultation  Pulmonary exam normal       Cardiovascular hypertension, Pt. on medications neg cardio ROS Regular Normal    Neuro/Psych Negative Neurological ROS  Negative Psych ROS   GI/Hepatic negative GI ROS, Neg liver ROS,   Endo/Other  Negative Endocrine ROSDiabetes mellitus-Morbid obesity  Renal/GU negative Renal ROS  Genitourinary negative   Musculoskeletal negative musculoskeletal ROS (+)   Abdominal   Peds negative pediatric ROS (+)  Hematology negative hematology ROS (+)   Anesthesia Other Findings   Reproductive/Obstetrics negative OB ROS                         Anesthesia Physical Anesthesia Plan  ASA: III  Anesthesia Plan: General   Post-op Pain Management:    Induction: Intravenous  Airway Management Planned: Oral ETT  Additional Equipment:   Intra-op Plan:   Post-operative Plan: Extubation in OR  Informed Consent: I have reviewed the patients History and Physical, chart, labs and discussed the procedure including the risks, benefits and alternatives for the proposed anesthesia with the patient or authorized representative who has indicated his/her understanding and acceptance.   Dental advisory given  Plan Discussed with: CRNA  Anesthesia Plan Comments:         Anesthesia Quick Evaluation

## 2011-10-05 NOTE — Transfer of Care (Signed)
Immediate Anesthesia Transfer of Care Note  [name]  Procedure(s) Performed:  COMPUTER ASSISTED TOTAL KNEE ARTHROPLASTY - preop femoral nerve block  Patient Location: PACU  Anesthesia Type: General  Level of Consciousness: sedated and patient cooperative  Airway & Oxygen Therapy: Patient Spontanous Breathing and Patient connected to face mask oxygen  Post-op Assessment: Report given to PACU RN  Post vital signs: Reviewed and stable  Complications: No apparent anesthesia complications

## 2011-10-06 LAB — BASIC METABOLIC PANEL
CO2: 27 mEq/L (ref 19–32)
Chloride: 102 mEq/L (ref 96–112)
Potassium: 4.3 mEq/L (ref 3.5–5.1)
Sodium: 137 mEq/L (ref 135–145)

## 2011-10-06 LAB — CBC
MCV: 86.9 fL (ref 78.0–100.0)
Platelets: 330 10*3/uL (ref 150–400)
RBC: 3.52 MIL/uL — ABNORMAL LOW (ref 3.87–5.11)
WBC: 11.3 10*3/uL — ABNORMAL HIGH (ref 4.0–10.5)

## 2011-10-06 MED ORDER — METHOCARBAMOL 500 MG PO TABS
500.0000 mg | ORAL_TABLET | Freq: Four times a day (QID) | ORAL | Status: DC | PRN
  Administered 2011-10-06 – 2011-10-09 (×8): 500 mg via ORAL
  Filled 2011-10-06 (×8): qty 1

## 2011-10-06 NOTE — Progress Notes (Signed)
Subjective: Pt stable pain controlled on pca - a little dizzy bed to chair   Objective: Vital signs in last 24 hours: Temp:  [97.9 F (36.6 C)-98.1 F (36.7 C)] 97.9 F (36.6 C) (11/10 0527) Pulse Rate:  [61-93] 61  (11/10 0527) Resp:  [10-20] 18  (11/10 1100) BP: (99-135)/(57-82) 104/64 mmHg (11/10 0527) SpO2:  [93 %-98 %] 96 % (11/10 1100) FiO2 (%):  [96 %] 96 % (11/10 0119) Weight:  [99.791 kg (220 lb)] 220 lb (99.791 kg) (11/09 1400)  Intake/Output from previous day: 11/09 0701 - 11/10 0700 In: 5236.5 [P.O.:600; I.V.:4436.5; IV Piggyback:200] Out: 2420 [Urine:1800; Drains:520; Blood:100] Intake/Output this shift: Total I/O In: 360 [P.O.:360] Out: -   Exam:  Sensation intact distally Intact pulses distally Dorsiflexion/Plantar flexion intact  Labs:  Basename 10/06/11 0425  HGB 9.8*    Basename 10/06/11 0425  WBC 11.3*  RBC 3.52*  HCT 30.6*  PLT 330    Basename 10/06/11 0425  NA 137  K 4.3  CL 102  CO2 27  BUN 17  CREATININE 0.87  GLUCOSE 150*  CALCIUM 8.3*   No results found for this basename: LABPT:2,INR:2 in the last 72 hours  Assessment/Plan: Doing well pod 1 from tka - tol cpm well - cont pain meds - all labs ok- cbg less than 200   Lindsey Williams 10/06/2011, 11:41 AM

## 2011-10-06 NOTE — Progress Notes (Signed)
Physical Therapy Treatment Patient Details Name: Lindsey Williams MRN: [id number] DOB: 1946/05/18 Today's Date: 10/06/2011 1355 - 1433  2GT PT Assessment/Plan  PT - Assessment/Plan PT Plan: Discharge plan remains appropriate PT Frequency: 7X/week Follow Up Recommendations: Home health PT Equipment Recommended: None recommended by PT PT Goals  Acute Rehab PT Goals PT Goal: Supine/Side to Sit - Progress: Not met PT Goal: Sit to Supine/Side - Progress: Not met PT Transfer Goal: Sit to Stand/Stand to Sit - Progress: Not met PT Goal: Ambulate - Progress: Partly met PT Goal: Up/Down Stairs - Progress: Not met  PT Treatment Precautions/Restrictions  Precautions Precautions: Knee Required Braces or Orthoses: Yes Knee Immobilizer: On except when in CPM Restrictions Weight Bearing Restrictions: No Mobility (including Balance) Bed Mobility Bed Mobility: Yes Sit to Supine - Right: 3: Mod assist Transfers Sit to Stand: 3: Mod assist Sit to Stand Details (indicate cue type and reason): cues for use of UEs and for LE position Stand to Sit: 3: Mod assist Ambulation/Gait Ambulation/Gait Assistance: 3: Mod assist Ambulation/Gait Assistance Details (indicate cue type and reason): cues for sequence, posture and position from RW Ambulation Distance (Feet):  (20', 41') Assistive device: Rolling walker Gait Pattern: Step-to pattern    Exercise    End of Session PT - End of Session Activity Tolerance: Patient tolerated treatment well Patient left: in bed;with call Lindsey Williams in reach General Behavior During Session: [name] for tasks performed Cognition: [name] for tasks performed  Lindsey Williams 10/06/2011, 2:42 PM

## 2011-10-06 NOTE — Progress Notes (Signed)
Physical Therapy Evaluation Patient Details Name: Lindsey Williams MRN: [id number] DOB: [birth year] Today's Date: 10/06/2011 1010 - 1050, Eval, TE Problem List: There is no problem list on file for this patient.   Past Medical History:  Past Medical History  Diagnosis Date  . No pertinent past medical history CONSTIPATION AND EPIGASTRIC PAIN    EPIGASTRIC PAIN  . Nausea & vomiting   . Diabetes mellitus     "PRE DIABETIC" DIET CONTROLLED  . Chronic kidney disease     BLADDER CANCER - FOLLOWED YEARLY  . Pneumonia     HX PE 1972 SUPERFICIAL PHLEBITIS 2010  . Hypertension   . Dysrhythmia     UNK ARRYTHMIA    Past Surgical History:  Past Surgical History  Procedure Date  . None 09/26/11    NO PREVIOUS SURG  . None   . Cardiac catheterization     8 YRS AGO/ STRESS ECHO 8 YRS AGO  . Joint replacement     RT TOTAL KNEE 2011  . Abdominal hysterectomy     1989  . Carpal tunnel release     BILATERAL  . Ulnar nerve repair     BIL  . Wrist arthroscopy     BIL  . Cholecystectomy   . Hemorroidectomy   . Breast biopsy     BIL  . Arcuate keratectomy   . Cystoscopy     YEARLY  . Inguinal hernia repair     PT Assessment/Plan/Recommendation PT Assessment Clinical Impression Statement: Pt with L TKR 10/05/11 presents with decreased strength, ROM and functional mobility and will benefit from skilled PT intervention to maximize I for d/c home with family assist and HHPT. PT Recommendation/Assessment: Patient will need skilled PT in the acute care venue PT Problem List: Decreased strength;Decreased range of motion;Decreased activity tolerance;Decreased mobility;Decreased knowledge of use of DME PT Therapy Diagnosis : Difficulty walking PT Plan PT Frequency: 7X/week PT Treatment/Interventions: DME instruction;Gait training;Stair training;Functional mobility training;Therapeutic exercise PT Recommendation Recommendations for Other Services: OT consult Follow Up Recommendations: Home  health PT Equipment Recommended: None recommended by PT PT Goals  Acute Rehab PT Goals PT Goal Formulation: With patient Time For Goal Achievement: 7 days Pt will go Supine/Side to Sit: with supervision PT Goal: Supine/Side to Sit - Progress: Not met Pt will go Sit to Supine/Side: with supervision PT Goal: Sit to Supine/Side - Progress: Not met Pt will Transfer Sit to Stand/Stand to Sit: with supervision PT Transfer Goal: Sit to Stand/Stand to Sit - Progress: Not met Pt will Ambulate: 51 - 150 feet PT Goal: Ambulate - Progress: Not met Pt will Go Up / Down Stairs: 1-2 stairs;with min assist;with least restrictive assistive device PT Goal: Up/Down Stairs - Progress: Not met  PT Evaluation Precautions/Restrictions  Precautions Precautions: Knee Required Braces or Orthoses: Yes Knee Immobilizer: On except when in CPM Restrictions Weight Bearing Restrictions: No Prior Functioning  Home Living Lives With: Family Receives Help From: Family Type of Home: House Home Layout: One level Home Access: Stairs to enter Entrance Stairs-Rails: None Entrance Stairs-Number of Steps: 1 Prior Function Level of Independence: Independent with basic ADLs;Independent with homemaking with ambulation Able to Take Stairs?: Yes Cognition Cognition Arousal/Alertness: Awake/alert Overall Cognitive Status: Appears within functional limits for tasks assessed Orientation Level: Oriented X4 Sensation/Coordination Coordination Gross Motor Movements are Fluid and Coordinated: Yes Fine Motor Movements are Fluid and Coordinated: Yes Extremity Assessment RUE Assessment RUE Assessment: Within Functional Limits LUE Assessment LUE Assessment: Within Functional Limits RLE Assessment RLE Assessment:  Within Functional Limits LLE Assessment LLE Assessment: Exceptions to [facility] LLE PROM (degrees) Left Knee Extension 0-130: -10 Left Knee Flexion 0-140: 35 LLE Strength Left Knee Extension: 2/5 Mobility  (including Balance) Bed Mobility Bed Mobility: Yes Supine to Sit: 3: Mod assist Transfers Transfers: Yes Sit to Stand: 3: Mod assist Sit to Stand Details (indicate cue type and reason): cues for LE position and use of UEs Stand to Sit: 3: Mod assist Ambulation/Gait Ambulation/Gait: Yes Ambulation/Gait Assistance: 3: Mod assist Ambulation/Gait Assistance Details (indicate cue type and reason): Cues for sequence, stride length and position from RW Ambulation Distance (Feet): 12 Feet Assistive device: Rolling walker Gait Pattern: Step-to pattern    Exercise  Total Joint Exercises Ankle Circles/Pumps: AROM;Left;10 reps;Supine Quad Sets: AROM;Left;10 reps;Supine Heel Slides: AAROM;Left;15 reps Straight Leg Raises: AAROM;Left;10 reps;Supine End of Session PT - End of Session Activity Tolerance: Patient limited by fatigue Patient left: in chair;with call Fyfe in reach General Behavior During Session: [facility] for tasks performed Cognition: [facility] for tasks performed  Jaydeen Odor 10/06/2011, 12:52 PM

## 2011-10-06 NOTE — Progress Notes (Signed)
CM spoke with pt concerning d/c planning. Per pt previously active with [facility]. CM wrote sticky note for MD to write resume HHRN/HHPT orders upon discharge. Pt previously on coumadin. Pt is married. Spouse to assist in care.

## 2011-10-07 ENCOUNTER — Encounter (HOSPITAL_COMMUNITY): Payer: Self-pay | Admitting: Orthopaedic Surgery

## 2011-10-07 DIAGNOSIS — M1712 Unilateral primary osteoarthritis, left knee: Secondary | ICD-10-CM

## 2011-10-07 HISTORY — DX: Unilateral primary osteoarthritis, left knee: M17.12

## 2011-10-07 LAB — CBC
HCT: 29.8 % — ABNORMAL LOW (ref 36.0–46.0)
Hemoglobin: 9.7 g/dL — ABNORMAL LOW (ref 12.0–15.0)
RBC: 3.44 MIL/uL — ABNORMAL LOW (ref 3.87–5.11)
WBC: 10.2 10*3/uL (ref 4.0–10.5)

## 2011-10-07 NOTE — Progress Notes (Signed)
Physical Therapy Treatment Patient Details Name: Lindsey Williams MRN: [id number] DOB: [birth year] Today's Date: 10/07/2011 2130-8657 2gt PT Assessment/Plan  PT - Assessment/Plan PT Plan: Discharge plan remains appropriate PT Frequency: 7X/week Follow Up Recommendations: Home health PT Equipment Recommended: None recommended by PT PT Goals  Acute Rehab PT Goals PT Goal: Supine/Side to Sit - Progress: Progressing toward goal PT Goal: Sit to Supine/Side - Progress: Progressing toward goal PT Transfer Goal: Sit to Stand/Stand to Sit - Progress: Progressing toward goal PT Goal: Ambulate - Progress: Progressing toward goal  PT Treatment Precautions/Restrictions  Precautions Precautions: Knee Precaution Comments: no pillow under knee Required Braces or Orthoses: Yes Knee Immobilizer: On except when in CPM Restrictions Weight Bearing Restrictions: Yes LLE Weight Bearing: Weight bearing as tolerated Mobility (including Balance) Bed Mobility Bed Mobility: Yes Supine to Sit: 4: Min assist;3: Mod assist Supine to Sit Details (indicate cue type and reason): verbal cues to scoot to EOB Sit to Supine - Right: 4: Min assist Transfers Transfers: Yes Sit to Stand: 3: Mod assist;From bed;From chair/3-in-1 Sit to Stand Details (indicate cue type and reason): cues for use of UEs and for LE position Stand to Sit: 4: Min assist;To bed;To chair/3-in-1 Stand to Sit Details: verbal cues for LE position and hand placement Ambulation/Gait Ambulation/Gait: Yes Ambulation/Gait Assistance: 4: Min assist;3: Mod assist Ambulation/Gait Assistance Details (indicate cue type and reason): amb 20' & 79' with 1 minute rest between d/t dizziness; verbal cues ffor RW distance from self, posture and sequence Ambulation Distance (Feet):  (20' 80') Assistive device: Rolling walker Gait Pattern: Step-to pattern    Exercise    End of Session PT - End of Session Equipment Utilized During Treatment: Left knee  immobilizer Activity Tolerance: Patient tolerated treatment well Patient left: in bed;with call Cabler in reach General Behavior During Session: [facility] for tasks performed Cognition: [facility] for tasks performed  [facility] 10/07/2011, 1:30 PM

## 2011-10-07 NOTE — Progress Notes (Signed)
Subjective: 2 Days Post-Op Procedure(s) (LRB): COMPUTER ASSISTED TOTAL KNEE ARTHROPLASTY (Left) Patient reports pain as moderate to severe.  No acute changes overnight.    Objective: Vital signs in last 24 hours: Temp:  [98 F (36.7 C)-99 F (37.2 C)] 99 F (37.2 C) (11/11 1105) Pulse Rate:  [62-90] 90  (11/11 1105) Resp:  [16-18] 16  (11/11 1258) BP: (104-143)/(63-79) 143/76 mmHg (11/11 1300) SpO2:  [93 %-99 %] 96 % (11/11 1258)  Intake/Output from previous day: 11/10 0701 - 11/11 0700 In: 2038.8 [P.O.:360; I.V.:1678.8] Out: 2000 [Urine:2000] Intake/Output this shift: Total I/O In: 100 [P.O.:100] Out: 350 [Urine:350]   Basename 10/07/11 0459 10/06/11 0425  HGB 9.7* 9.8*    Basename 10/07/11 0459 10/06/11 0425  WBC 10.2 11.3*  RBC 3.44* 3.52*  HCT 29.8* 30.6*  PLT 285 330    Basename 10/06/11 0425  NA 137  K 4.3  CL 102  CO2 27  BUN 17  CREATININE 0.87  GLUCOSE 150*  CALCIUM 8.3*   No results found for this basename: LABPT:2,INR:2 in the last 72 hours  Neurovascular intact Sensation intact distally Intact pulses distally Incision: scant drainage Compartment soft  Assessment/Plan: 2 Days Post-Op Procedure(s) (LRB): COMPUTER ASSISTED TOTAL KNEE ARTHROPLASTY (Left) 1) continue PT/OT 2) d/c pca 3) likely d/c to home Tuesday. Lindsey Williams Y 10/07/2011, 2:43 PM

## 2011-10-07 NOTE — Plan of Care (Signed)
Problem: Consults Goal: Diagnosis- Total Joint Replacement Primary Total Knee     

## 2011-10-07 NOTE — Progress Notes (Signed)
Physical Therapy Treatment Patient Details Name: Lindsey Williams MRN: [id number] DOB: February 15, 1946 Today's Date: 10/07/2011 9147-8295  1te PT Assessment/Plan  PT - Assessment/Plan PT Plan: Discharge plan remains appropriate PT Frequency: 7X/week Follow Up Recommendations: Home health PT Equipment Recommended: None recommended by PT PT Goals  Acute Rehab PT Goals Pt will Perform Home Exercise Program: with supervision, verbal cues required/provided PT Goal: Perform Home Exercise Program - Progress: Progressing toward goal  PT Treatment Precautions/Restrictions  Precautions Precautions: Knee Precaution Comments: no pillow under knee Required Braces or Orthoses: Yes Knee Immobilizer: On except when in CPM Restrictions Weight Bearing Restrictions: Yes LLE Weight Bearing: Weight bearing as tolerated Mobility (including Balance)      Exercise  Total Joint Exercises Ankle Circles/Pumps: AROM;Both;10 reps Quad Sets: AROM;Both;10 reps;Supine Towel Squeeze: AROM;Both;10 reps Heel Slides: AAROM;Left;10 reps;Supine Hip ABduction/ADduction: 10 reps;AAROM;Supine Straight Leg Raises: AAROM;Left;10 reps;Supine End of Session PT - End of Session Activity Tolerance: Patient tolerated treatment well Patient left: in bed;with call Wenner in reach General Behavior During Session: [facility] for tasks performed Cognition: [facility] for tasks performed  [facility] 10/07/2011, 5:27 PM

## 2011-10-08 LAB — CBC
Hemoglobin: 9.4 g/dL — ABNORMAL LOW (ref 12.0–15.0)
MCH: 28.1 pg (ref 26.0–34.0)
MCV: 85.3 fL (ref 78.0–100.0)
RBC: 3.34 MIL/uL — ABNORMAL LOW (ref 3.87–5.11)
WBC: 9 10*3/uL (ref 4.0–10.5)

## 2011-10-08 NOTE — Progress Notes (Signed)
Physical Therapy Treatment Patient Details Name: Lindsey Williams MRN: [id number] DOB: 1946-04-22 Today's Date: 10/08/2011 1110-1139 1 gt 1 te PT Assessment/Plan  PT - Assessment/Plan Comments on Treatment Session: pt doing well  some nausea this am, better now PT Plan: Discharge plan remains appropriate PT Frequency: 7X/week Follow Up Recommendations: Home health PT Equipment Recommended: None recommended by PT PT Goals  Acute Rehab PT Goals PT Goal: Sit to Supine/Side - Progress: Progressing toward goal PT Transfer Goal: Sit to Stand/Stand to Sit - Progress: Progressing toward goal PT Goal: Ambulate - Progress: Progressing toward goal PT Goal: Perform Home Exercise Program - Progress: Progressing toward goal  PT Treatment Precautions/Restrictions  Precautions Precautions: Knee Precaution Comments: no pillow under knee Required Braces or Orthoses: Yes Knee Immobilizer: On except when in CPM Restrictions Weight Bearing Restrictions: Yes LLE Weight Bearing: Weight bearing as tolerated Mobility (including Balance) Bed Mobility Bed Mobility: Yes Supine to Sit: 4: Min assist Sit to Supine - Right: 4: Min assist Sit to Supine - Right Details (indicate cue type and reason): min with LLE Transfers Sit to Stand: 5: Supervision;From chair/3-in-1 Sit to Stand Details (indicate cue type and reason): From EOB and 3:1/chair w/ use of RW Stand to Sit: 5: Supervision;To bed Stand to Sit Details: verbal cues for hand placement and LLE position Ambulation/Gait Ambulation/Gait Assistance: 5: Supervision Ambulation/Gait Assistance Details (indicate cue type and reason): min/guard A;  verbal cues for sequence Ambulation Distance (Feet): 80 Feet Assistive device: Rolling walker Gait Pattern: Step-to pattern    Exercise  Total Joint Exercises Ankle Circles/Pumps: AROM;Both;15 reps;Supine Quad Sets: Supine;10 reps;AROM Gluteal Sets: AROM;10 reps;Supine Heel Slides: 10  reps;Supine;AAROM Straight Leg Raises: AAROM;Supine;10 reps End of Session PT - End of Session Equipment Utilized During Treatment: Gait belt;Left knee immobilizer Activity Tolerance: Patient tolerated treatment well Patient left: in bed General Behavior During Session: [facility] for tasks performed Cognition: [facility] for tasks performed  [facility] [phone] 10/08/2011, 12:00 PM

## 2011-10-08 NOTE — Progress Notes (Signed)
Physical Therapy Treatment Patient Details Name: Lindsey Williams MRN: [id number] DOB: [birth year] Today's Date: 10/08/2011 4098-1191 1G PT Assessment/Plan  PT - Assessment/Plan Comments on Treatment Session: pt doing well  some nausea this am, better now PT Plan: Discharge plan remains appropriate PT Frequency: 7X/week Follow Up Recommendations: Home health PT Equipment Recommended: None recommended by PT PT Goals  Acute Rehab PT Goals PT Goal: Supine/Side to Sit - Progress: Progressing toward goal PT Goal: Sit to Supine/Side - Progress: Progressing toward goal PT Transfer Goal: Sit to Stand/Stand to Sit - Progress: Progressing toward goal PT Goal: Ambulate - Progress: Progressing toward goal PT Goal: Up/Down Stairs - Progress: Progressing toward goal PT Goal: Perform Home Exercise Program - Progress: Progressing toward goal  PT Treatment Precautions/Restrictions  Precautions Precautions: Knee Precaution Comments: no pillow under knee Required Braces or Orthoses: Yes Knee Immobilizer: On except when in CPM Restrictions Weight Bearing Restrictions: No LLE Weight Bearing: Weight bearing as tolerated Mobility (including Balance) Bed Mobility Bed Mobility: Yes Supine to Sit: 4: Min assist;HOB flat Sit to Supine - Right: 4: Min assist Transfers Transfers: Yes Sit to Stand: 5: Supervision;From bed Stand to Sit: 5: Supervision;To bed;With upper extremity assist Ambulation/Gait Ambulation/Gait: Yes Ambulation/Gait Assistance: 5: Supervision Ambulation Distance (Feet): 90 Feet Assistive device: Rolling walker Gait Pattern: Step-to pattern    Exercise    End of Session PT - End of Session Equipment Utilized During Treatment: Left knee immobilizer Activity Tolerance: Patient tolerated treatment well Patient left: in bed General Behavior During Session: [facility] for tasks performed Cognition: [facility] for tasks performed  Rada Hay 10/08/2011, 4:00 PM

## 2011-10-08 NOTE — Progress Notes (Signed)
Subjective: 3 Days Post-Op Procedure(s) (LRB): COMPUTER ASSISTED TOTAL KNEE ARTHROPLASTY (Left) Decent progress post-op.  No acute changes. A little nausea.    Objective: Vital signs in last 24 hours: Temp:  [98.6 F (37 C)-99.3 F (37.4 C)] 99 F (37.2 C) (11/12 0540) Pulse Rate:  [82-95] 95  (11/12 0540) Resp:  [16-18] 16  (11/12 0540) BP: (112-143)/(70-89) 143/89 mmHg (11/12 0540) SpO2:  [92 %-99 %] 92 % (11/12 0540)  Intake/Output from previous day: 11/11 0701 - 11/12 0700 In: 200 [P.O.:200] Out: 1800 [Urine:1800] Intake/Output this shift: Total I/O In: -  Out: 700 [Urine:700]   Basename 10/08/11 0402 10/07/11 0459 10/06/11 0425  HGB 9.4* 9.7* 9.8*    Basename 10/08/11 0402 10/07/11 0459  WBC 9.0 10.2  RBC 3.34* 3.44*  HCT 28.5* 29.8*  PLT 274 285    Basename 10/06/11 0425  NA 137  K 4.3  CL 102  CO2 27  BUN 17  CREATININE 0.87  GLUCOSE 150*  CALCIUM 8.3*   No results found for this basename: LABPT:2,INR:2 in the last 72 hours  Neurovascular intact Intact pulses distally Compartment soft  Assessment/Plan: 3 Days Post-Op Procedure(s) (LRB): COMPUTER ASSISTED TOTAL KNEE ARTHROPLASTY (Left) Plan for discharge tomorrow  Lindsey Williams 10/08/2011, 6:34 AM

## 2011-10-08 NOTE — Progress Notes (Signed)
Occupational Therapy Evaluation Patient Details Name: Lindsey Williams MRN: [id number] DOB: November 30, 1945 Today's Date: 10/08/2011 Time 9:30-9:52am Problem List:  Patient Active Problem List  Diagnoses  . Osteoarthritis of left knee    Past Medical History:  Past Medical History  Diagnosis Date  . No pertinent past medical history CONSTIPATION AND EPIGASTRIC PAIN    EPIGASTRIC PAIN  . Nausea & vomiting   . Diabetes mellitus     "PRE DIABETIC" DIET CONTROLLED  . Chronic kidney disease     BLADDER CANCER - FOLLOWED YEARLY  . Pneumonia     HX PE 1972 SUPERFICIAL PHLEBITIS 2010  . Hypertension   . Dysrhythmia     UNK ARRYTHMIA   . Osteoarthritis of left knee 10/07/2011   Past Surgical History:  Past Surgical History  Procedure Date  . None 09/26/11    NO PREVIOUS SURG  . None   . Cardiac catheterization     8 YRS AGO/ STRESS ECHO 8 YRS AGO  . Joint replacement     RT TOTAL KNEE 2011  . Abdominal hysterectomy     1989  . Carpal tunnel release     BILATERAL  . Ulnar nerve repair     BIL  . Wrist arthroscopy     BIL  . Cholecystectomy   . Hemorroidectomy   . Breast biopsy     BIL  . Arcuate keratectomy   . Cystoscopy     YEARLY  . Inguinal hernia repair     OT Assessment/Plan/Recommendation OT Assessment Clinical Impression Statement: Pt has DME and will have PRN assist from Lindsey Williams at d/c from acute setting, reports no further OT needs (had right TKR approx 1 year ago). Pt is currently supervision-Min A level for ADL's and selfcare tasks; will sign off OT secondary to pt w/o further needs OT Recommendation/Assessment: Patient does not need any further OT services OT Recommendation Equipment Recommended: None recommended by OT OT Goals    OT Evaluation Precautions/Restrictions  Precautions Precautions: Knee Precaution Comments: no pillow under knee Required Braces or Orthoses: Yes Knee Immobilizer: On except when in CPM Restrictions Weight Bearing Restrictions:  Yes LLE Weight Bearing: Weight bearing as tolerated Prior Functioning Home Living Bathroom Shower/Tub: Engineer, manufacturing systems: Standard Bathroom Accessibility: Yes How Accessible: Accessible via walker Home Adaptive Equipment: Bedside commode/3-in-1;Raised toilet seat with rails Prior Function Level of Independence: Independent with basic ADLs;Independent with homemaking with ambulation;Independent with transfers;Independent with gait ADL ADL Eating/Feeding: Performed;Modified independent Where Assessed - Eating/Feeding: Bed level Grooming: Performed;Wash/dry hands;Wash/dry face;Supervision/safety Grooming Details (indicate cue type and reason): VC's for safety with RW to bring up to sink vs leave to side and lean on sink Where Assessed - Grooming: Standing at sink Upper Body Bathing: Simulated;Modified independent Where Assessed - Upper Body Bathing: Sitting, chair;Supported Lower Body Bathing: Simulated;Minimal assistance Where Assessed - Lower Body Bathing: Supported;Sitting, chair Upper Body Dressing: Simulated;Modified independent Where Assessed - Upper Body Dressing: Sitting, chair;Sitting, bed;Unsupported Lower Body Dressing: Performed;Minimal assistance Lower Body Dressing Details (indicate cue type and reason): Don/doff socks Where Assessed - Lower Body Dressing: Sitting, bed;Unsupported Toilet Transfer: Performed;Supervision/safety Toilet Transfer Details (indicate cue type and reason): VC's safety  Toilet Transfer Method: Proofreader: Raised toilet seat with arms (or 3-in-1 over toilet) Toileting - Clothing Manipulation: Performed;Supervision/safety Where Assessed - Toileting Clothing Manipulation: Sit to stand from 3-in-1 or toilet Toileting - Hygiene: Performed;Supervision/safety Toileting - Hygiene Details (indicate cue type and reason): SBA for safety and sequencing with RW during clothing  manipulation and toileting hygeine  noted Where Assessed - Toileting Hygiene: Sit to stand from 3-in-1 or toilet Equipment Used: Rolling walker;Other (comment) (Gait belt) ADL Comments: Pt reports having had right TKR last year and will have PRN assist from her daughter at home. Has DME and reports no other needs from OT. Recommend initial supervision and PRN assist for ADL's/selfcare tasks. will sign off OT at this time. Vision/Perception  Vision - History Baseline Vision: Wears glasses only for reading Patient Visual Report: No change from baseline Vision - Assessment Eye Alignment: Within Functional Limits Cognition Cognition Arousal/Alertness: Awake/alert Overall Cognitive Status: Appears within functional limits for tasks assessed Orientation Level: Oriented X4 Sensation/Coordination Sensation Light Touch: Appears Intact Coordination Gross Motor Movements are Fluid and Coordinated: Yes Fine Motor Movements are Fluid and Coordinated: Yes Extremity Assessment RUE Assessment RUE Assessment: Within Functional Limits LUE Assessment LUE Assessment: Within Functional Limits Mobility  Bed Mobility Bed Mobility: Yes Supine to Sit: 4: Min assist Transfers Transfers: Yes Sit to Stand: 5: Supervision;From chair/3-in-1;From bed Sit to Stand Details (indicate cue type and reason): From EOB and 3:1/chair w/ use of RW Stand to Sit: 5: Supervision;To chair/3-in-1 Exercises   End of Session OT - End of Session Equipment Utilized During Treatment: Gait belt;Other (comment);Left knee immobilizer (RW) Activity Tolerance: Patient tolerated treatment well Patient left: in chair;with call [name] in reach General Behavior During Session: Lindsey Williams for tasks performed Cognition: [facility] for tasks performed   Alm Bustard 10/08/2011, 11:14 AM

## 2011-10-09 ENCOUNTER — Encounter (HOSPITAL_COMMUNITY): Payer: Self-pay | Admitting: Orthopaedic Surgery

## 2011-10-09 MED ORDER — RIVAROXABAN 10 MG PO TABS: 10.0000 mg | ORAL_TABLET | Freq: Every day | ORAL | Status: DC

## 2011-10-09 MED ORDER — METHOCARBAMOL 500 MG PO TABS: 500.0000 mg | ORAL_TABLET | Freq: Four times a day (QID) | ORAL | Status: AC | PRN

## 2011-10-09 MED ORDER — OXYCODONE-ACETAMINOPHEN 5-325 MG PO TABS: 1.0000 | ORAL_TABLET | ORAL | Status: AC | PRN

## 2011-10-09 NOTE — Discharge Summary (Signed)
Physician Discharge Summary  Patient ID: Lindsey Williams MRN: [id number] DOB/AGE: 64-Jun-1947 64 y.o.  Admit date: 10/05/2011 Discharge date:   Admission Diagnoses:  Active Problems:  Osteoarthritis of left knee   Discharge Diagnoses:  Same  Past Medical History  Diagnosis Date  . No pertinent past medical history CONSTIPATION AND EPIGASTRIC PAIN    EPIGASTRIC PAIN  . Nausea & vomiting   . Diabetes mellitus     "PRE DIABETIC" DIET CONTROLLED  . Chronic kidney disease     BLADDER CANCER - FOLLOWED YEARLY  . Pneumonia     HX PE 1972 SUPERFICIAL PHLEBITIS 2010  . Hypertension   . Dysrhythmia     UNK ARRYTHMIA   . Osteoarthritis of left knee 10/07/2011    Surgeries: Procedure(s): COMPUTER ASSISTED TOTAL KNEE ARTHROPLASTY on 10/05/2011   Consultants:  none  Discharged Condition: Improved  Hospital Course: Lindsey Williams is an 65 y.o. female who was admitted 10/05/2011 with a chief complaint of No chief complaint on file. , and found to have a diagnosis of OA/DJD left knee.  They were brought to the operating room on 10/05/2011 and underwent the above named procedures.    They were given perioperative antibiotics:  Anti-infectives     Start     Dose/Rate Route Frequency Ordered Stop   10/05/11 1445   ceFAZolin (ANCEF) IVPB 2 g/50 mL premix        2 g 100 mL/hr over 30 Minutes Intravenous Every 6 hours 10/05/11 1436 10/06/11 0254   10/05/11 0645   ceFAZolin (ANCEF) IVPB 2 g/50 mL premix        2 g 100 mL/hr over 30 Minutes Intravenous 60 min pre-op 10/05/11 0644 10/05/11 0835        .  They were given sequential compression devices, early ambulation, and chemoprophylaxis for DVT prophylaxis.  They benefited maximally from their hospital stay and there were no complications.    Recent vital signs:  Filed Vitals:   10/08/11 2138  BP: 131/65  Pulse: 99  Temp: 99.1 F (37.3 C)  Resp: 16    Recent laboratory studies:  Results for orders placed during the hospital  encounter of 10/05/11  TYPE AND SCREEN      Component Value Range   ABO/RH(D) O POS     Antibody Screen NEG     Sample Expiration 10/08/2011    GLUCOSE, CAPILLARY      Component Value Range   Glucose-Capillary 139 (*) 70 - 99 (mg/dL)  ABO/RH      Component Value Range   ABO/RH(D) O POS    GLUCOSE, CAPILLARY      Component Value Range   Glucose-Capillary 161 (*) 70 - 99 (mg/dL)   Comment 1 Documented in Chart     Comment 2 Notify RN    CBC      Component Value Range   WBC 11.3 (*) 4.0 - 10.5 (K/uL)   RBC 3.52 (*) 3.87 - 5.11 (MIL/uL)   Hemoglobin 9.8 (*) 12.0 - 15.0 (g/dL)   HCT 91.4 (*) 78.2 - 46.0 (%)   MCV 86.9  78.0 - 100.0 (fL)   MCH 27.8  26.0 - 34.0 (pg)   MCHC 32.0  30.0 - 36.0 (g/dL)   RDW 95.6  21.3 - 08.6 (%)   Platelets 330  150 - 400 (K/uL)  BASIC METABOLIC PANEL      Component Value Range   Sodium 137  135 - 145 (mEq/L)   Potassium 4.3  3.5 -  5.1 (mEq/L)   Chloride 102  96 - 112 (mEq/L)   CO2 27  19 - 32 (mEq/L)   Glucose, Bld 150 (*) 70 - 99 (mg/dL)   BUN 17  6 - 23 (mg/dL)   Creatinine, Ser 1.61  0.50 - 1.10 (mg/dL)   Calcium 8.3 (*) 8.4 - 10.5 (mg/dL)   GFR calc non Af Amer 69 (*) >90 (mL/min)   GFR calc Af Amer 80 (*) >90 (mL/min)  CBC      Component Value Range   WBC 10.2  4.0 - 10.5 (K/uL)   RBC 3.44 (*) 3.87 - 5.11 (MIL/uL)   Hemoglobin 9.7 (*) 12.0 - 15.0 (g/dL)   HCT 09.6 (*) 04.5 - 46.0 (%)   MCV 86.6  78.0 - 100.0 (fL)   MCH 28.2  26.0 - 34.0 (pg)   MCHC 32.6  30.0 - 36.0 (g/dL)   RDW 40.9  81.1 - 91.4 (%)   Platelets 285  150 - 400 (K/uL)  CBC      Component Value Range   WBC 9.0  4.0 - 10.5 (K/uL)   RBC 3.34 (*) 3.87 - 5.11 (MIL/uL)   Hemoglobin 9.4 (*) 12.0 - 15.0 (g/dL)   HCT 78.2 (*) 95.6 - 46.0 (%)   MCV 85.3  78.0 - 100.0 (fL)   MCH 28.1  26.0 - 34.0 (pg)   MCHC 33.0  30.0 - 36.0 (g/dL)   RDW 21.3  08.6 - 57.8 (%)   Platelets 274  150 - 400 (K/uL)    Discharge Medications:  Percocet, Robaxin, Xarelto Current Discharge  Medication List    CONTINUE these medications which have NOT CHANGED   Details  B Complex-C (B-COMPLEX WITH VITAMIN C) tablet Take 1 tablet by mouth daily.      calcium-vitamin D (OSCAL WITH D) 500-200 MG-UNIT per tablet Take 0.5 tablets by mouth daily.      cholecalciferol (VITAMIN D) 1000 UNITS tablet Take 1,000 Units by mouth daily.      metoprolol (TOPROL-XL) 50 MG 24 hr tablet Take 50 mg by mouth every evening.      valsartan-hydrochlorothiazide (DIOVAN-HCT) 320-12.5 MG per tablet Take 1 tablet by mouth every evening.        STOP taking these medications     acetaminophen (TYLENOL) 500 MG tablet      aspirin EC 325 MG tablet      celecoxib (CELEBREX) 200 MG capsule      Homeopathic Products ([employer] LEG CRAMPS PAIN RELIEF PO)         Diagnostic Studies: Dg Chest 2 View  09/26/2011  *RADIOLOGY REPORT*  Clinical Data: Preop for total knee replacement.  Hypertension. Nonsmoker.  CHEST - 2 VIEW  Comparison: 08/22/2010  Findings: Midline trachea.  Borderline cardiomegaly.  Aortic atherosclerosis. Mediastinal contours otherwise within normal limits.  No pleural effusion or pneumothorax.  Biapical pleural thickening.  Slight asymmetry of vascular shadows; slightly greater projecting over the right upper lobe than left.  Lungs clear.  IMPRESSION: No acute cardiopulmonary disease.  Original Report Authenticated By: Consuello Bossier, M.D.   Dg Knee Left Port  10/05/2011  *RADIOLOGY REPORT*  Clinical Data: Status post arthroplasty.  Status post total knee.  PORTABLE LEFT KNEE - 1-2 VIEW  Comparison: None.  Findings: Anterior and lateral views are performed, showing the patient to have had a total knee arthroplasty. Screw tracts overlie the distal femur and proximal tibia.  Surgical clips overlie the anterior aspect of the knee.  A surgical drain  overlies the joint. Alignment is normal.  No evidence for fracture.  IMPRESSION: Status post total knee arthroplasty.  No adverse features.  Original  Report Authenticated By: Patterson Hammersmith, M.D.    Disposition:  D/C to home with HHPT. F/U 2 weeks in office.      SignedKathryne Hitch 10/09/2011, 6:27 AM

## 2011-10-09 NOTE — Progress Notes (Signed)
Physical Therapy Treatment Patient Details Name: Lindsey Williams MRN: [id number] DOB: 1946-05-19 Today's Date: 10/09/2011 10:05 - 10:40    1 gt   1 te PT Assessment/Plan  PT - Assessment/Plan Comments on Treatment Session: pt has met goals to D/C to home PT Plan: Discharge plan remains appropriate Follow Up Recommendations: Home health PT Equipment Recommended: Rolling walker with 5" wheels (has all equipment from prior TKR) PT Goals  Acute Rehab PT Goals PT Goal Formulation: With patient Pt will go Supine/Side to Sit: with supervision PT Goal: Supine/Side to Sit - Progress: Met Pt will go Sit to Supine/Side: with supervision PT Goal: Sit to Supine/Side - Progress: Met Pt will Transfer Sit to Stand/Stand to Sit: with supervision PT Transfer Goal: Sit to Stand/Stand to Sit - Progress: Met Pt will Ambulate: 51 - 150 feet;with supervision PT Goal: Ambulate - Progress: Partly met Pt will Go Up / Down Stairs: 1-2 stairs;with min assist;with rolling walker PT Goal: Up/Down Stairs - Progress: Met Pt will Perform Home Exercise Program: with supervision, verbal cues required/provided PT Goal: Perform Home Exercise Program - Progress: Partly met  PT Treatment Precautions/Restrictions  Precautions Precautions: Knee Precaution Comments: no pillow under knee Required Braces or Orthoses: Yes Knee Immobilizer: Discontinue once straight leg raise with < 10 degree lag Restrictions Weight Bearing Restrictions: No LLE Weight Bearing: Weight bearing as tolerated Mobility (including Balance) Bed Mobility Bed Mobility: Yes Supine to Sit: 5: Supervision Transfers Transfers: Yes Sit to Stand: 5: Supervision Stand to Sit: 5: Supervision Ambulation/Gait Ambulation/Gait: Yes Ambulation/Gait Assistance: 5: Supervision Ambulation/Gait Assistance Details (indicate cue type and reason): 50% VC's to equal WBing thru B LE and alternate gait pattern Ambulation Distance (Feet): 85 Feet Assistive device:  Rolling walker Gait Pattern: Step-through pattern Stairs: No Wheelchair Mobility Wheelchair Mobility: No    Exercise  Total Joint Exercises Ankle Circles/Pumps: AROM;Both;10 reps Quad Sets: AROM;Both;10 reps Gluteal Sets: AROM;Both;10 reps Towel Squeeze: AROM;Both;10 reps Short Arc QuadBarbaraann Williams;Left;10 reps Heel Slides: AAROM;Left;10 reps Hip ABduction/ADduction: AAROM;Left;10 reps Straight Leg Raises: AAROM;Left;10 reps End of Session PT - End of Session Equipment Utilized During Treatment: Gait belt Activity Tolerance: Patient tolerated treatment well Patient left: in chair;with call [name] in reach Nurse Communication:  (pt ready for D/C to home)  Armando Reichert 10/09/2011, 11:00 AM

## 2012-03-31 ENCOUNTER — Ambulatory Visit (HOSPITAL_COMMUNITY)
Admission: RE | Admit: 2012-03-31 | Discharge: 2012-03-31 | Disposition: A | Discharge status: Home / Self Care | Payer: 59 | Source: Ambulatory Visit | Attending: Orthopaedic Surgery | Admitting: Orthopaedic Surgery

## 2012-03-31 ENCOUNTER — Other Ambulatory Visit: Payer: Self-pay | Admitting: Orthopaedic Surgery

## 2012-03-31 DIAGNOSIS — R52 Pain, unspecified: Secondary | ICD-10-CM

## 2012-03-31 DIAGNOSIS — R609 Edema, unspecified: Secondary | ICD-10-CM

## 2012-03-31 DIAGNOSIS — M7989 Other specified soft tissue disorders: Secondary | ICD-10-CM

## 2012-03-31 DIAGNOSIS — M79609 Pain in unspecified limb: Secondary | ICD-10-CM

## 2012-03-31 DIAGNOSIS — I82819 Embolism and thrombosis of superficial veins of unspecified lower extremities: Secondary | ICD-10-CM | POA: Insufficient documentation

## 2012-03-31 DIAGNOSIS — M79606 Pain in leg, unspecified: Secondary | ICD-10-CM

## 2012-03-31 NOTE — Progress Notes (Signed)
VASCULAR LAB PRELIMINARY  PRELIMINARY  PRELIMINARY  PRELIMINARY  Left lower extremity venous Doppler completed.    Preliminary report:  There is no DVT in the left lower extremity.  There is a SVT at the anterior knee at site of knot and soreness.  Sherren Kerns Sherman, 03/31/2012, 11:52 AM

## 2013-09-14 IMAGING — CR DG CHEST 2V
2 series · 2 of 2 positions shown · non-contrast
Comparison: 08/22/2010

CLINICAL DATA: Preop for total knee replacement.  Hypertension.
Nonsmoker.

CHEST - 2 VIEW

[w chest pa]
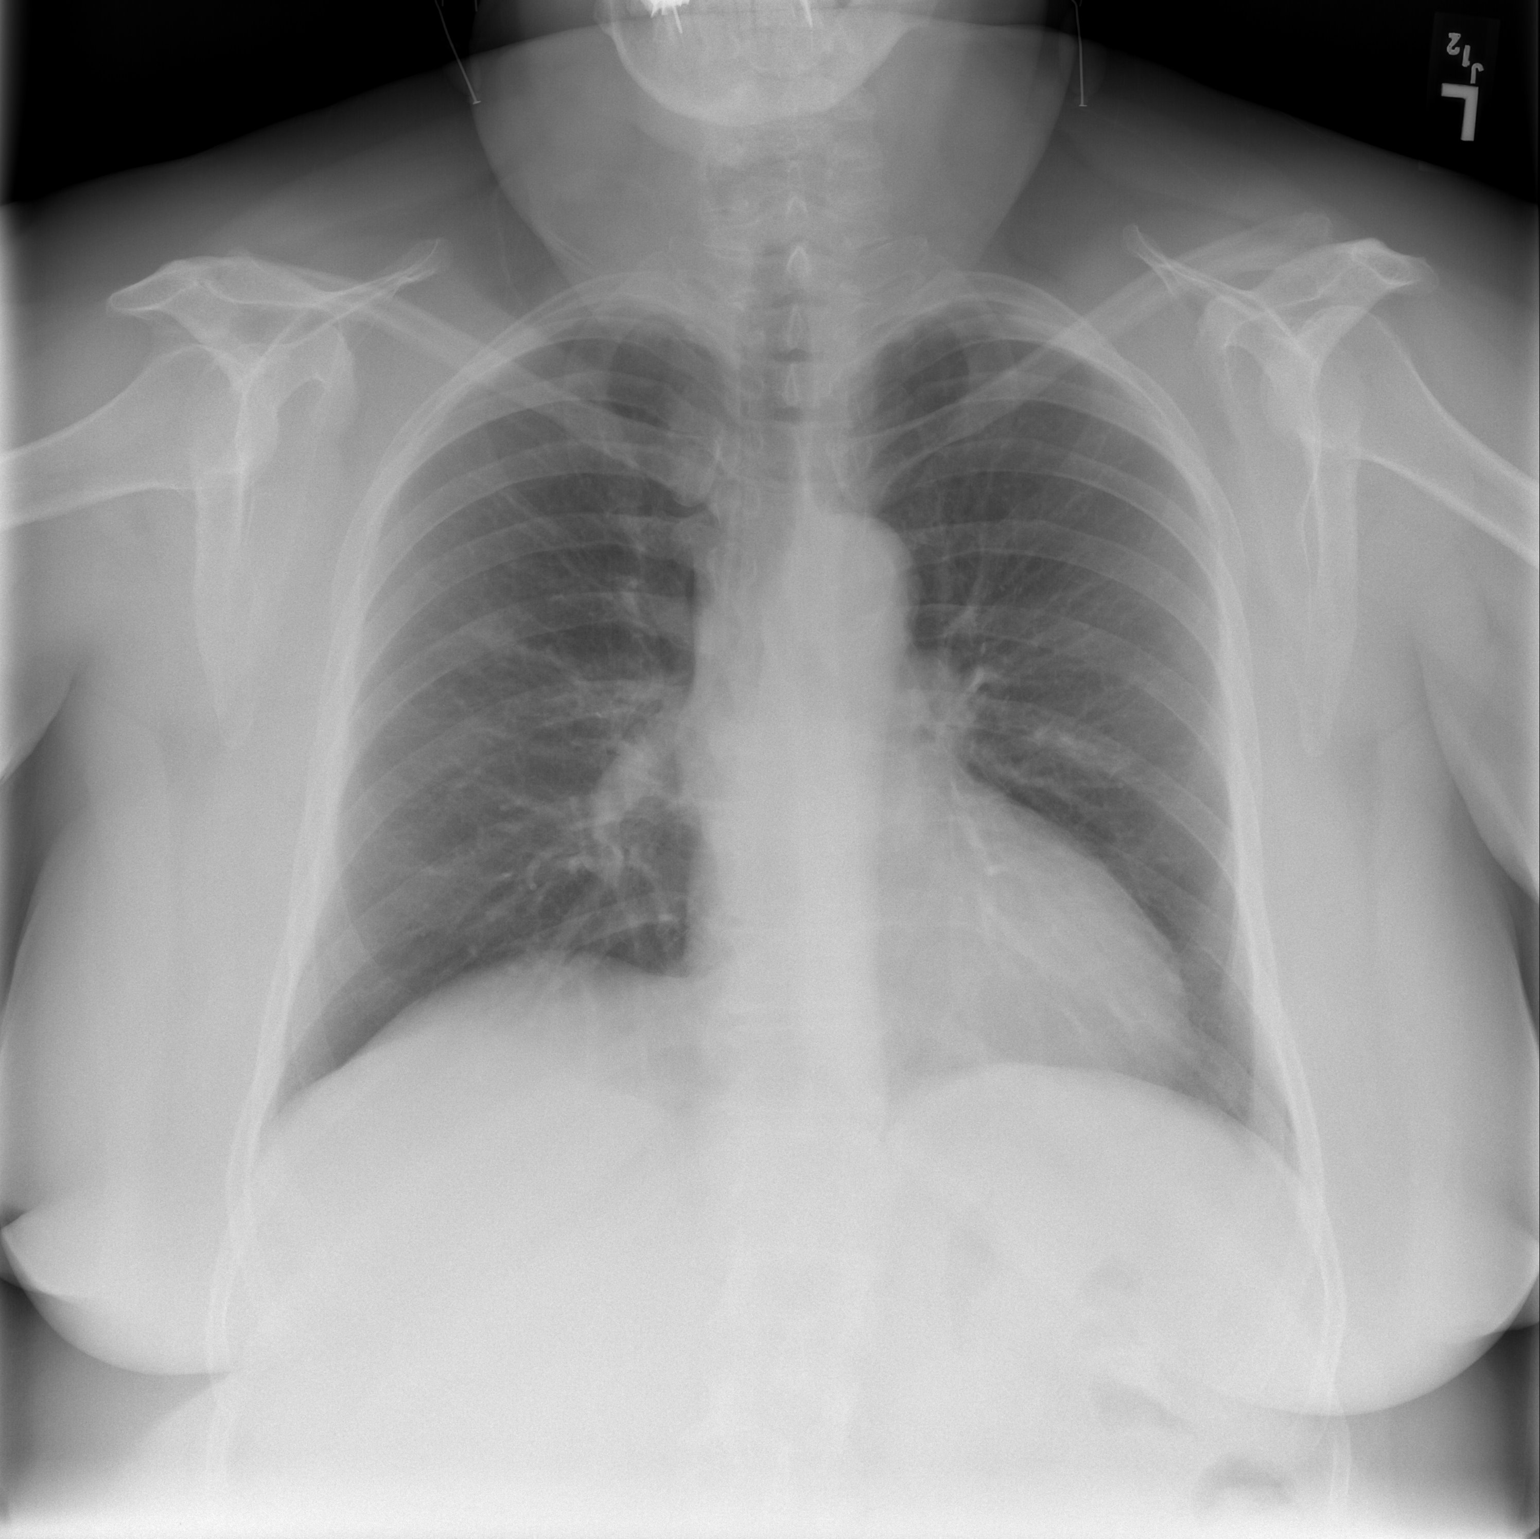

[w chest lat]
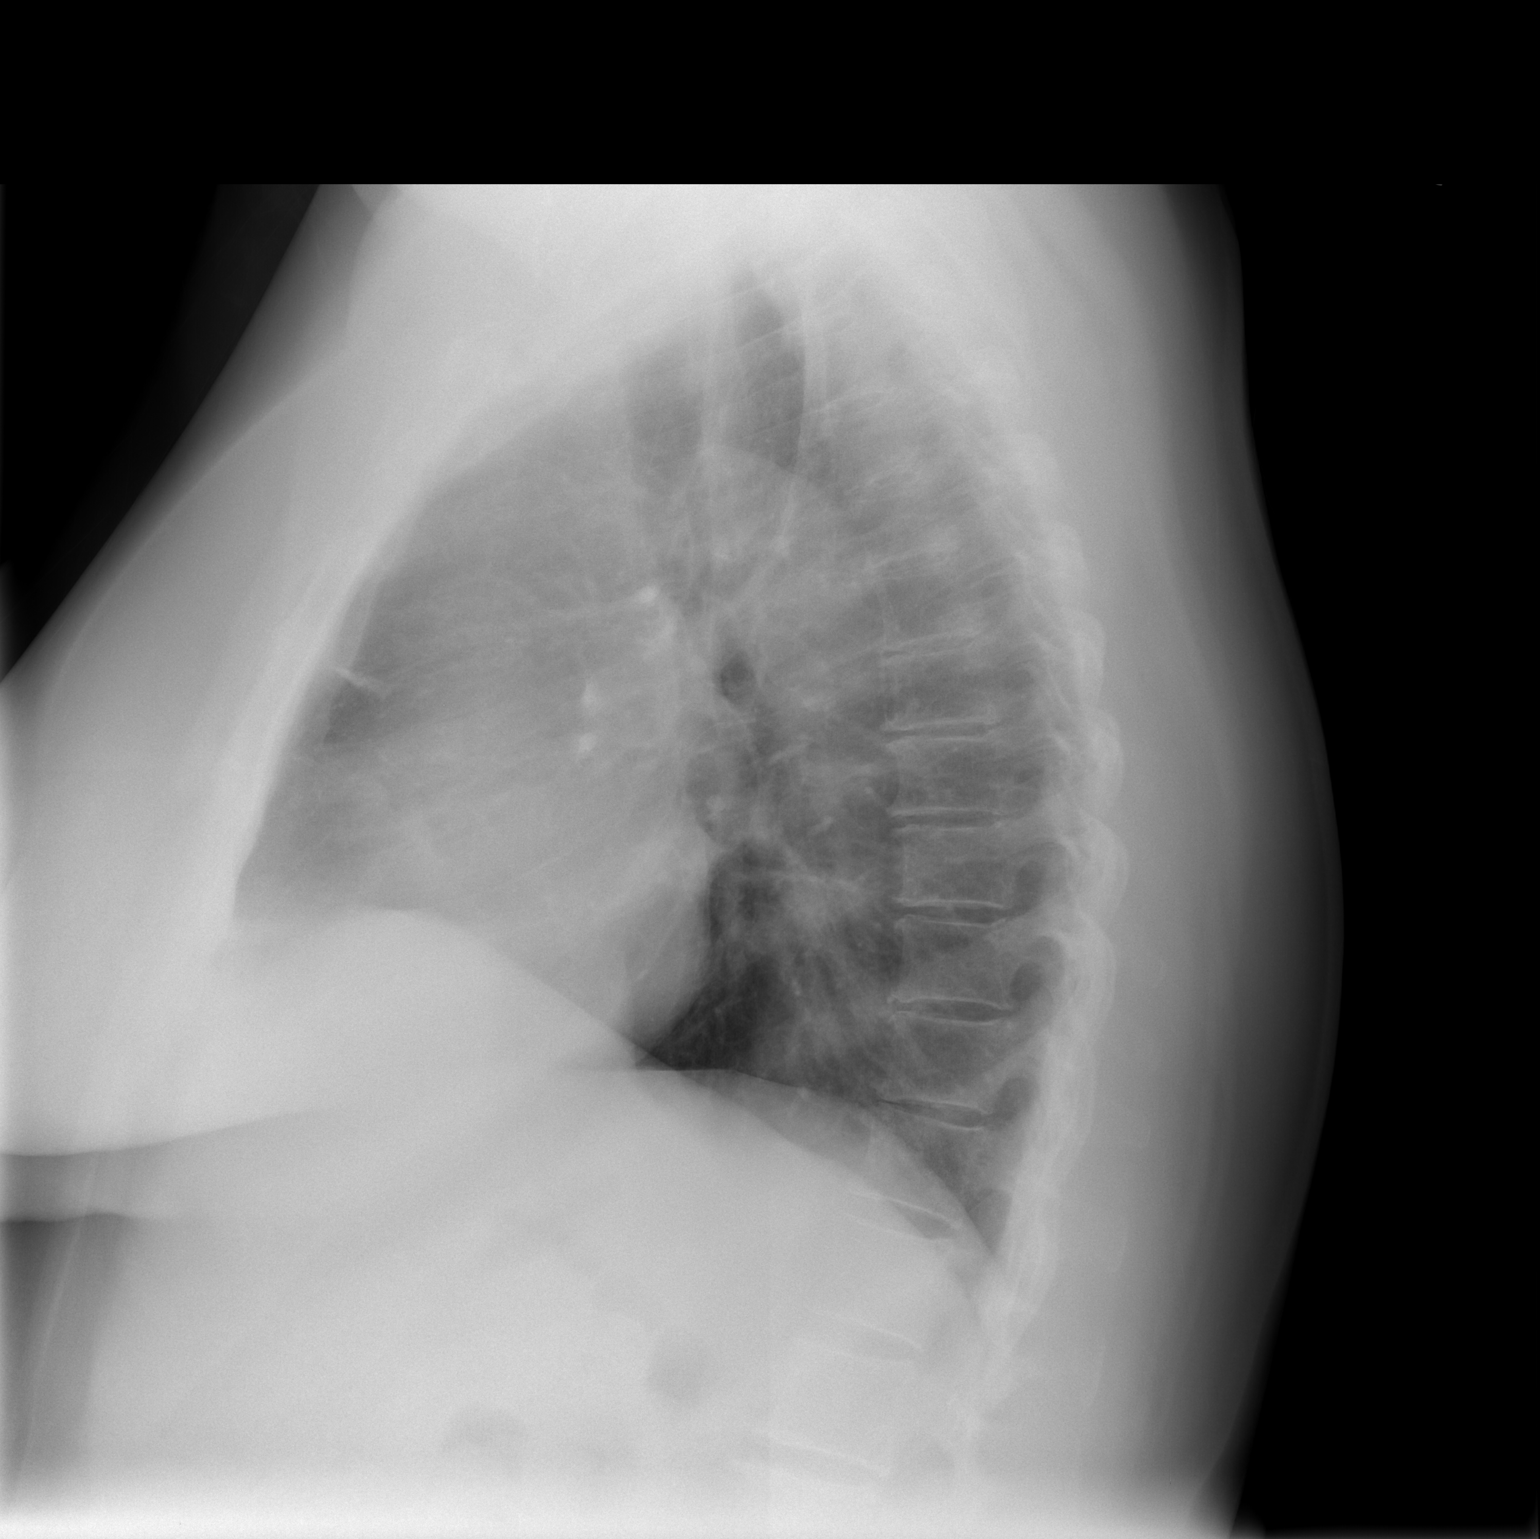

[2 of 2 positions shown; findings below may reference images not displayed]

FINDINGS: Midline trachea.  Borderline cardiomegaly.  Aortic
atherosclerosis. Mediastinal contours otherwise within normal
limits.  No pleural effusion or pneumothorax.  Biapical pleural
thickening.  Slight asymmetry of vascular shadows; slightly greater
projecting over the right upper lobe than left.  Lungs clear.
IMPRESSION: No acute cardiopulmonary disease.

## 2013-09-23 IMAGING — CR DG KNEE 1-2V PORT*L*
2 series · 2 of 2 positions shown · non-contrast
Comparison: None.

CLINICAL DATA: Status post arthroplasty.  Status post total knee.

PORTABLE LEFT KNEE - 1-2 VIEW

[view not recorded (1 of 2)]
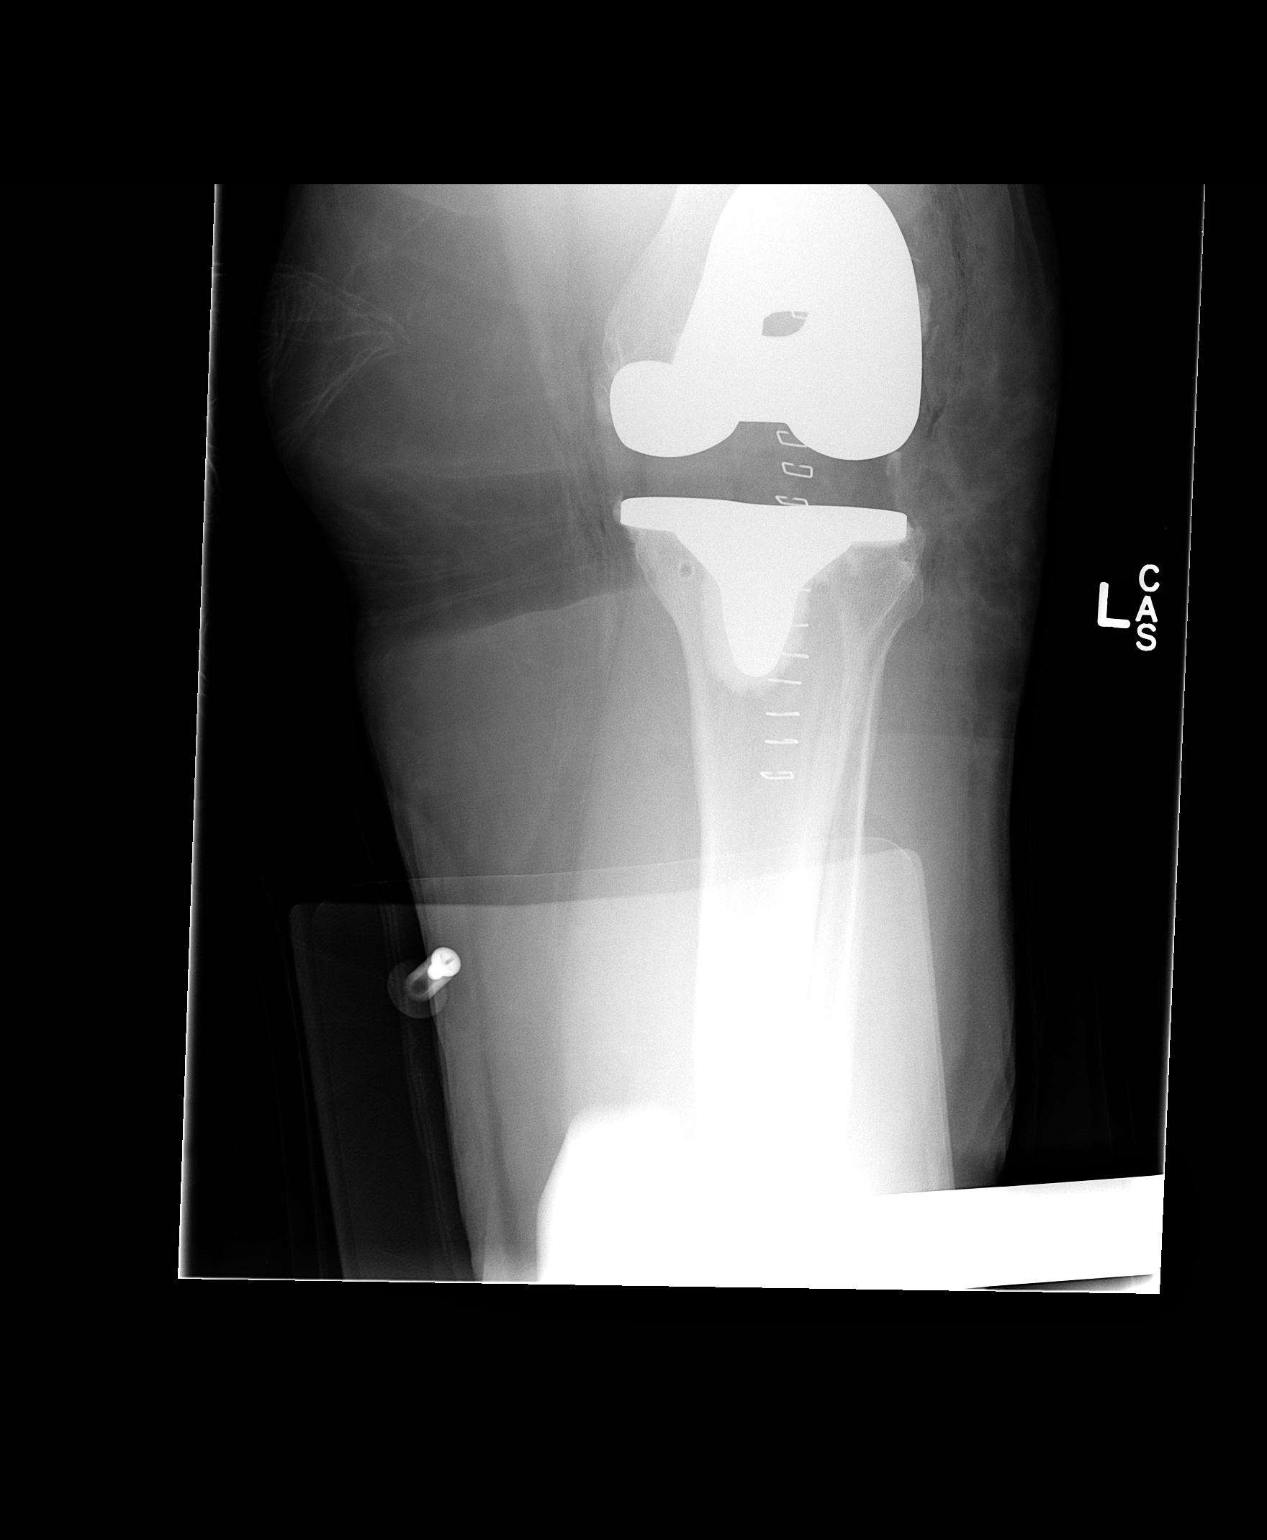

[view not recorded (2 of 2)]
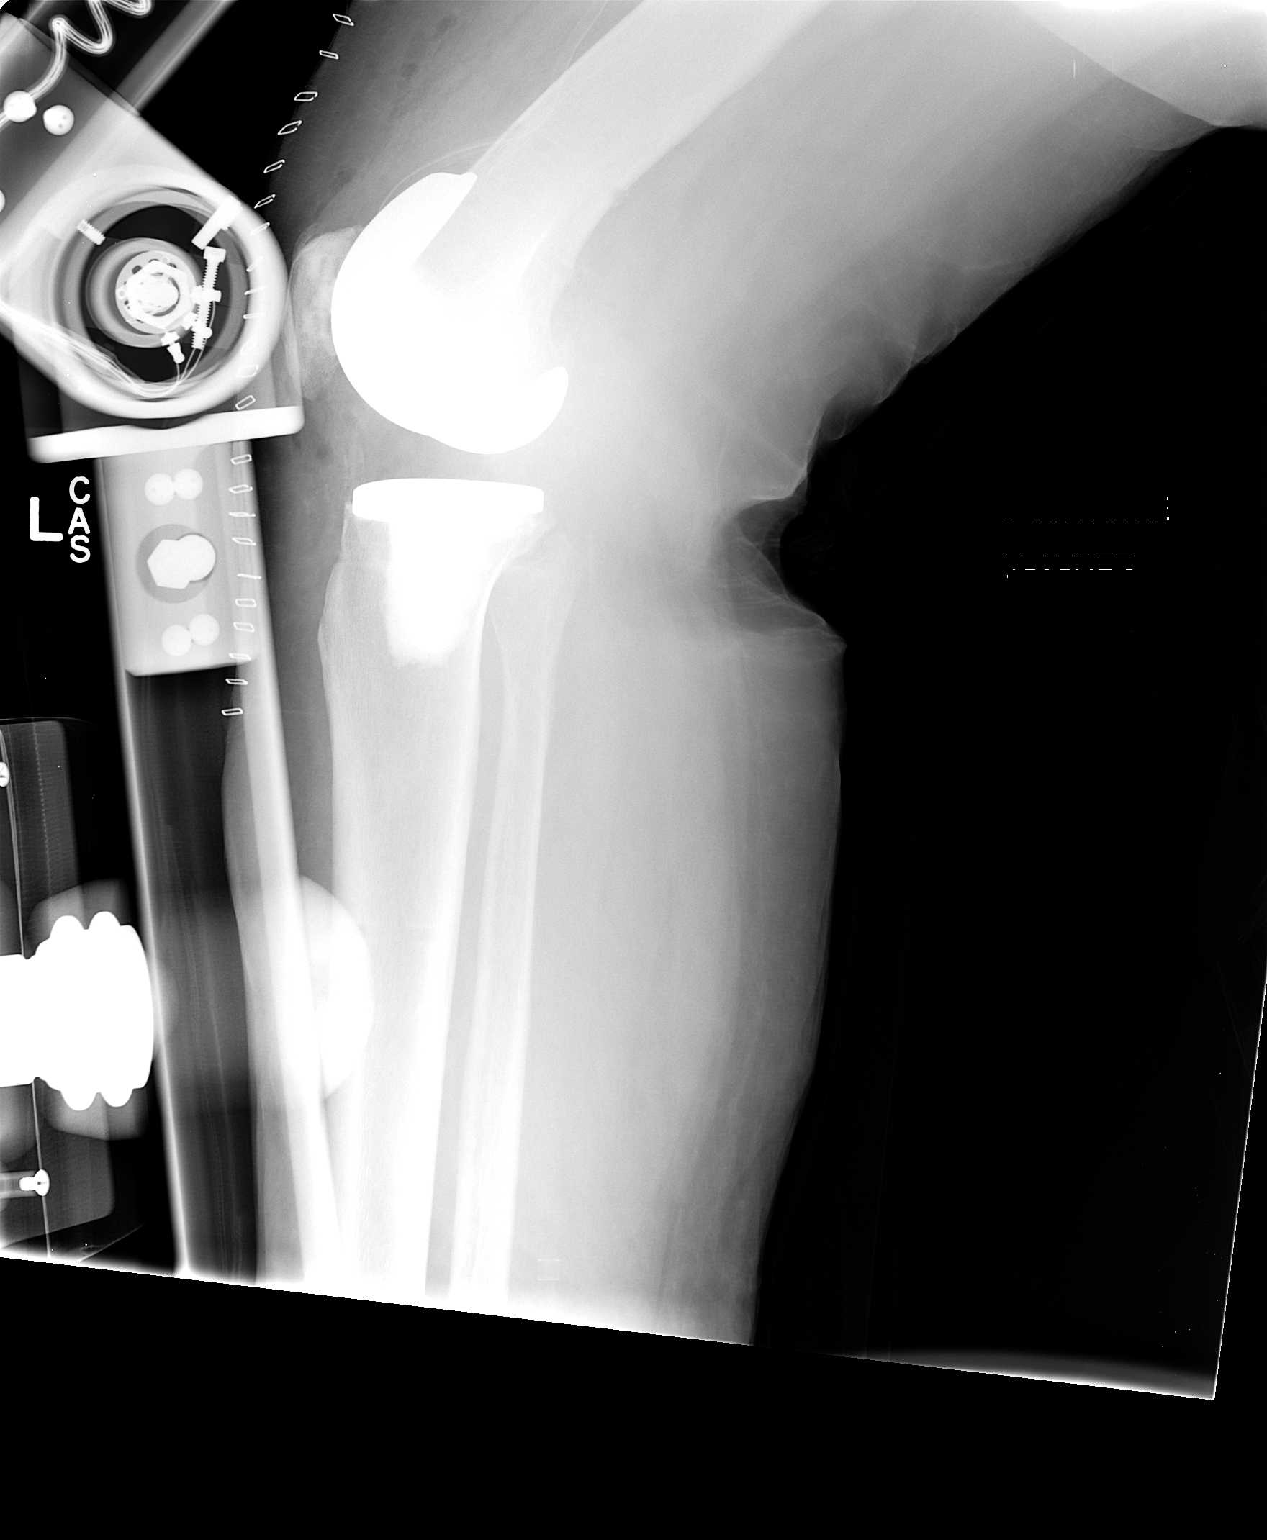

[2 of 2 positions shown; findings below may reference images not displayed]

FINDINGS: Anterior and lateral views are performed, showing the
patient to have had a total knee arthroplasty. Screw tracts overlie
the distal femur and proximal tibia.  Surgical clips overlie the
anterior aspect of the knee.  A surgical drain overlies the joint.
Alignment is normal.  No evidence for fracture.
IMPRESSION: Status post total knee arthroplasty.  No adverse features.

## 2016-09-13 ENCOUNTER — Ambulatory Visit (INDEPENDENT_AMBULATORY_CARE_PROVIDER_SITE_OTHER): Payer: 59 | Admitting: Orthopaedic Surgery

## 2016-09-13 ENCOUNTER — Encounter (INDEPENDENT_AMBULATORY_CARE_PROVIDER_SITE_OTHER): Payer: Self-pay

## 2016-09-13 DIAGNOSIS — M25512 Pain in left shoulder: Secondary | ICD-10-CM | POA: Diagnosis not present

## 2017-03-01 ENCOUNTER — Ambulatory Visit (INDEPENDENT_AMBULATORY_CARE_PROVIDER_SITE_OTHER): Payer: Medicare Other | Admitting: Physician Assistant

## 2017-03-04 ENCOUNTER — Ambulatory Visit (INDEPENDENT_AMBULATORY_CARE_PROVIDER_SITE_OTHER): Payer: 59 | Admitting: Physician Assistant

## 2017-03-04 ENCOUNTER — Encounter (INDEPENDENT_AMBULATORY_CARE_PROVIDER_SITE_OTHER): Payer: Self-pay | Admitting: Physician Assistant

## 2017-03-04 ENCOUNTER — Encounter (INDEPENDENT_AMBULATORY_CARE_PROVIDER_SITE_OTHER): Payer: Self-pay

## 2017-03-04 DIAGNOSIS — M25512 Pain in left shoulder: Secondary | ICD-10-CM | POA: Diagnosis not present

## 2017-03-04 MED ORDER — LIDOCAINE HCL 1 % IJ SOLN
1.0000 mL | INTRAMUSCULAR | Status: AC | PRN
  Administered 2017-03-04: 1 mL

## 2017-03-04 MED ORDER — LIDOCAINE HCL 1 % IJ SOLN
3.0000 mL | INTRAMUSCULAR | Status: AC | PRN
  Administered 2017-03-04: 3 mL

## 2017-03-04 MED ORDER — METHYLPREDNISOLONE ACETATE 40 MG/ML IJ SUSP
40.0000 mg | INTRAMUSCULAR | Status: AC | PRN
  Administered 2017-03-04: 40 mg via INTRA_ARTICULAR

## 2017-03-04 NOTE — Progress Notes (Signed)
Office Visit Note   [name]           Date of Birth: 05-27-1946           MRN: [id number] Visit Date: 03/04/2017              Requested by: Phylliss Bob, MD [address] [place], [place] [address] PCP: Phylliss Bob, MD   Assessment & Plan: Visit Diagnoses:  1. Left shoulder pain, unspecified chronicity     Plan: Discussed with her wall crawls, pendulum, and Codman and  flexion exercises of the shoulder. See her back in 2 weeks' check progress lack of.  Follow-Up Instructions: Return in about 2 weeks (around 03/18/2017).   Orders:  Orders Placed This Encounter  Procedures  . Large Joint Injection/Arthrocentesis   No orders of the defined types were placed in this encounter.     Procedures: Large Joint Inj Date/Time: 03/04/2017 5:06 PM Performed by: Kirtland Bouchard Authorized by: Kirtland Bouchard   Consent Given by:  Patient Indications:  Pain Location:  Shoulder Site:  L subacromial bursa Needle Size:  25 G Needle Length:  1.5 inches and 3.5 inches Approach:  Lateral Ultrasound Guidance: No   Fluoroscopic Guidance: No   Arthrogram: No   Medications:  1 mL lidocaine 1 %; 40 mg methylPREDNISolone acetate 40 MG/ML; 3 mL lidocaine 1 % Aspiration Attempted: No   Patient tolerance:  Patient tolerated the procedure well with no immediate complications     Clinical Data: No additional findings.   Subjective: Chief Complaint  Patient presents with  . 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 She had reviews injection by Dr. Magnus Ivan on 09/13/2016 subacromial which helped with her shoulder until recently. This past Friday she went to the ER due to chest pain palpitations. She states she had a heaviness in her chest pain in her jaw pain also radiating down into her fingers shortness of breath also. Reports blood pressure 216/120 in the ER. Reports her EKG and labs were all normal. She has  seen her general practitioner who stated that possibly pain was coming from her shoulder. Does note that Aleve has helped with the palpitations in the chest pain. Decreased range of motion of the shoulder though. She has a history of ulnar nerve decompression bilaterally and carpal tunnel release bilaterally. Films of her left shoulder at her last office visit 01/15/2016 that were read as normal by Dr. Magnus Ivan.  Review of Systems Positive for left shoulder pain and decreased range of motion left shoulder. Positive for chest pain shortness breath. She's had no fevers or chills  Objective: Vital Signs: There were no vitals taken for this visit.  Physical Exam  Constitutional: She is oriented to person, place, and time. She appears well-developed and well-nourished. No distress.  Cardiovascular: Intact distal pulses.   Neurological: She is alert and oriented to person, place, and time.  Psychiatric: She has a normal mood and affect. Her behavior is normal.    Ortho Exam Bilateral shoulder she has 5 out of 5 strength with external and internal rotation against resistance. Positive impingement sign on the left positive liftoff test on the left. Empty can test negative bilaterally. Forward flexion left shoulder actively she come to 170 passively I bring her to 180 this causes some discomfort. Specialty Comments:  No specialty comments available.  Imaging: No results found.   PMFS History: Patient Active  Problem List   Diagnosis Date Noted  . Osteoarthritis of left knee 10/07/2011   Past Medical History:  Diagnosis Date  . Chronic kidney disease    BLADDER CANCER - FOLLOWED YEARLY  . Diabetes mellitus    "PRE DIABETIC" DIET CONTROLLED  . Dysrhythmia    UNK ARRYTHMIA   . Hypertension   . Nausea & vomiting   . No pertinent past medical history CONSTIPATION AND EPIGASTRIC PAIN   EPIGASTRIC PAIN  . Osteoarthritis of left knee 10/07/2011  . Pneumonia    HX PE 1972 SUPERFICIAL  PHLEBITIS 2010    No family history on file.  Past Surgical History:  Procedure Laterality Date  . ABDOMINAL HYSTERECTOMY     1989  . ARCUATE KERATECTOMY    . BREAST BIOPSY     BIL  . CARDIAC CATHETERIZATION     8 YRS AGO/ STRESS ECHO 8 YRS AGO  . CARPAL TUNNEL RELEASE     BILATERAL  . CHOLECYSTECTOMY    . CYSTOSCOPY     YEARLY  . HEMORROIDECTOMY    . INGUINAL HERNIA REPAIR    . JOINT REPLACEMENT     RT TOTAL KNEE 2011  . KNEE ARTHROPLASTY  10/05/2011   Procedure: COMPUTER ASSISTED TOTAL KNEE ARTHROPLASTY;  Surgeon: Kathryne Hitch;  Location: [facility];  Service: Orthopedics;  Laterality: Left;  preop femoral nerve block  . NONE  09/26/11   NO PREVIOUS SURG  . NONE    . ULNAR NERVE REPAIR     BIL  . WRIST ARTHROSCOPY     BIL   Social History   Occupational History  . Not on file.   Social History Main Topics  . Smoking status: Never Smoker  . Smokeless tobacco: Never Used  . Alcohol use No  . Drug use: No  . Sexual activity: Not on file

## 2017-05-20 ENCOUNTER — Ambulatory Visit (INDEPENDENT_AMBULATORY_CARE_PROVIDER_SITE_OTHER): Payer: 59 | Admitting: Physician Assistant

## 2017-05-20 ENCOUNTER — Encounter (INDEPENDENT_AMBULATORY_CARE_PROVIDER_SITE_OTHER): Payer: Self-pay | Admitting: Physician Assistant

## 2017-05-20 DIAGNOSIS — M7712 Lateral epicondylitis, left elbow: Secondary | ICD-10-CM

## 2017-05-20 MED ORDER — LIDOCAINE HCL 1 % IJ SOLN
3.0000 mL | INTRAMUSCULAR | Status: AC | PRN
  Administered 2017-05-20: 3 mL

## 2017-05-20 MED ORDER — METHYLPREDNISOLONE ACETATE 40 MG/ML IJ SUSP
40.0000 mg | INTRAMUSCULAR | Status: AC | PRN
  Administered 2017-05-20: 40 mg

## 2017-05-20 NOTE — Progress Notes (Signed)
   Procedure Note  [name]             Date of Birth: July 08, 1946           MRN: [id number]             Visit Date: 05/20/2017  History of present illness Lindsey Williams returns today follow-up left shoulder pain. She had an intra-articular injection for 918. States this helped with the shoulder pain. Now she is having left elbow pain set a history of bilateral ulnar nerve decompression back in the 1990s and bilateral carpal tunnel releases. States the pain she feels almost like a hip funnybone. She is taking Advil for the pain is helped some. Has some difficulty lifting heavy objects with the left arm.  Physical exam: Bilateral shoulders 5 out of 5 strengths against resistance with external and internal rotation. She has full forward flexion of the left shoulder to 180 actively. Empty can test is negative bilaterally. Negative impingement testing bilaterally. Left elbow she has tenderness over the lateral epicondyle. Extension of the wrist against resistance causes pain in the elbow region. Procedures: Visit Diagnoses: Lateral epicondylitis, left elbow  Hand/UE Inj Date/Time: 05/20/2017 2:14 PM Performed by: Kirtland BouchardLARK, Elvenia Godden W Authorized by: Kirtland BouchardLARK, Michaela Broski W   Consent Given by:  Patient Indications:  Pain and therapeutic Condition: lateral epicondylitis   Site:  L elbow Needle Size:  25 G Approach:  Lateral Ultrasound Guidance: No   Medications:  3 mL lidocaine 1 %; 40 mg methylPREDNISolone acetate 40 MG/ML   Plan: Stretching exercises shown for the lateral epicondylitis. She'll follow with us on an as-needed basis pain persists or becomes worse. If she is to have any recurrent radicular symptoms in the arm shoulder she'll call our office and most likely will order EMG nerve conduction studies at that time.

## 2018-01-02 ENCOUNTER — Telehealth (INDEPENDENT_AMBULATORY_CARE_PROVIDER_SITE_OTHER): Payer: Self-pay | Admitting: Orthopaedic Surgery

## 2018-01-02 NOTE — Telephone Encounter (Signed)
ok 

## 2018-01-02 NOTE — Telephone Encounter (Signed)
Patient called asking if a letter could be faxed to her employer stating that she cannot stand for long periods of time, and needs a stool. She works at [employer] and the fax number is [phone]. She's had two total knee replacements

## 2018-01-02 NOTE — Telephone Encounter (Signed)
That will be fine. 

## 2018-01-03 ENCOUNTER — Encounter (INDEPENDENT_AMBULATORY_CARE_PROVIDER_SITE_OTHER): Payer: Self-pay

## 2018-01-03 NOTE — Telephone Encounter (Signed)
Faxed note to provided number

## 2018-02-12 ENCOUNTER — Telehealth (INDEPENDENT_AMBULATORY_CARE_PROVIDER_SITE_OTHER): Payer: Self-pay | Admitting: Orthopaedic Surgery

## 2018-02-12 NOTE — Telephone Encounter (Signed)
Patient called to state a letter was sent to her job to return to work and it was faxed to a machine that prohibits PHI and it was rejected. Requesting the letter be sent to attn:  [employer] [phone]

## 2018-02-12 NOTE — Telephone Encounter (Signed)
Faxed to provided number  

## 2019-06-15 ENCOUNTER — Ambulatory Visit (INDEPENDENT_AMBULATORY_CARE_PROVIDER_SITE_OTHER): Payer: BC Managed Care – PPO

## 2019-06-15 ENCOUNTER — Encounter: Payer: Self-pay | Admitting: Physician Assistant

## 2019-06-15 ENCOUNTER — Ambulatory Visit: Payer: Self-pay

## 2019-06-15 ENCOUNTER — Ambulatory Visit (INDEPENDENT_AMBULATORY_CARE_PROVIDER_SITE_OTHER): Payer: BC Managed Care – PPO | Admitting: Physician Assistant

## 2019-06-15 VITALS — Ht 64.0 in | Wt 235.0 lb

## 2019-06-15 DIAGNOSIS — M25561 Pain in right knee: Secondary | ICD-10-CM

## 2019-06-15 DIAGNOSIS — M545 Low back pain, unspecified: Secondary | ICD-10-CM

## 2019-06-15 DIAGNOSIS — M25562 Pain in left knee: Secondary | ICD-10-CM

## 2019-06-15 MED ORDER — IBUPROFEN 800 MG PO TABS: 800.0000 mg | ORAL_TABLET | Freq: Two times a day (BID) | ORAL | 0 refills | Status: DC | PRN

## 2019-06-15 NOTE — Progress Notes (Signed)
Office Visit Note   [name]           Date of Birth: 01-Apr-1946           MRN: [id number] Visit Date: 06/15/2019              Requested by: Delanna Notice, [address] [place] [address] PCP: Delanna Notice, MD   Assessment & Plan: Visit Diagnoses:  1. Acute right-sided low back pain, unspecified whether sciatica present   2. Acute pain of both knees     Plan: Due to the fact the patient overall is doing well at this time would not recommend any treatment outside of therapy.  Offered formal therapy she defers.  Therefore she is given some back exercises that she can perform on her own at home.  Should follow-up with [place] on as-needed basis pain persist becomes worse.  Questions encouraged and answered.  Follow-Up Instructions: Return if symptoms worsen or fail to improve.   Orders:  Orders Placed This Encounter  Procedures   XR Lumbar Spine 2-3 Views   XR Knee 1-2 Views Right   XR Knee 1-2 Views Left   No orders of the defined types were placed in this encounter.     Procedures: No procedures performed   Clinical Data: No additional findings.   Subjective: Chief Complaint  Patient presents with   Lower Back - Pain   Right Knee - Pain   Left Knee - Pain    HPI Lindsey Williams is a 73 year old female well-known to our department service comes in today for low back pain for proximally last 10 days.  She states however that her back pain has greatly dissipated.  She also is having some pain down into both knees.  She is history of bilateral knee replacements.  She is had no injury to the back or knees.  She denies any bowel or bladder dysfunction.  She denies any waking pain due to to the back but does have cramps in both legs.  She states that the pain is been worse since having some type of venous procedure on both lower legs 2 years ago.  She does have severe leg cramps bilaterally.  She is concerned about her knees mainly due to the fact that they  became "puffy" about the time that she started having low back pain.  She has had no fevers or chills. Review of Systems See HPI  Objective: Vital Signs: Ht 5\' 4"  (1.626 m)    Wt 235 lb (106.6 kg)    BMI 40.34 kg/m   Physical Exam Constitutional:      Appearance: She is not ill-appearing or diaphoretic.  Pulmonary:     Effort: Pulmonary effort is normal.  Neurological:     Mental Status: She is alert and oriented to person, place, and time.     Ortho Exam Lower extremities 5 out of 5 strength throughout.  Negative straight leg raise bilaterally.  Tenderness over the mid to lower lumbar spinal column and the lumbar paraspinous region bilaterally.  Good range of motion both hips without pain.  Bilateral knees she has full range of motion of both knees without pain.  Surgical incisions well-healed.  There is no abnormal warmth erythema or effusion of either knee.  Anterior drawer is negative bilaterally.  Specialty Comments:  No specialty comments available.  Imaging: Xr Knee 1-2 Views Left  Result Date: 06/15/2019 Left knee AP and lateral views: No acute fracture.  No bony abnormalities.  Well-seated total knee components.  Xr Knee 1-2 Views Right  Result Date: 06/15/2019 Right knee AP and lateral views: No acute fractures.  No bony abnormalities.  Total knee components well-seated.  Xr Lumbar Spine 2-3 Views  Result Date: 06/15/2019 Lumbar spine AP lateral views: No acute fractures.  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  Endplate spurring off L1-L3 vertebral bodies.  No spondylolisthesis.  No bony abnormalities otherwise.    PMFS History: Patient Active Problem List   Diagnosis Date Noted   Osteoarthritis of left knee 10/07/2011   Past Medical History:  Diagnosis Date   Chronic kidney disease    BLADDER CANCER - FOLLOWED YEARLY   Diabetes mellitus    "PRE DIABETIC" DIET CONTROLLED   Dysrhythmia    UNK ARRYTHMIA      Hypertension    Nausea & vomiting    No pertinent past medical history CONSTIPATION AND EPIGASTRIC PAIN   EPIGASTRIC PAIN   Osteoarthritis of left knee 10/07/2011   Pneumonia    HX PE 1972 SUPERFICIAL PHLEBITIS 2010    History reviewed. No pertinent family history.  Past Surgical History:  Procedure Laterality Date   ABDOMINAL HYSTERECTOMY     1989   ARCUATE KERATECTOMY     BREAST BIOPSY     BIL   CARDIAC CATHETERIZATION     8 YRS AGO/ STRESS ECHO 8 YRS AGO   CARPAL TUNNEL RELEASE     BILATERAL   CHOLECYSTECTOMY     CYSTOSCOPY     YEARLY   HEMORROIDECTOMY     INGUINAL HERNIA REPAIR     JOINT REPLACEMENT     RT TOTAL KNEE 2011   KNEE ARTHROPLASTY  10/05/2011   Procedure: COMPUTER ASSISTED TOTAL KNEE ARTHROPLASTY;  Surgeon: Kathryne Hitchhristopher Y Blackman;  Location: [facility];  Service: Orthopedics;  Laterality: Left;  preop femoral nerve block   NONE  09/26/11   NO PREVIOUS SURG   NONE     ULNAR NERVE REPAIR     BIL   WRIST ARTHROSCOPY     BIL   Social History   Occupational History   Not on file  Tobacco Use   Smoking status: Never Smoker   Smokeless tobacco: Never Used  Substance and Sexual Activity   Alcohol use: No   Drug use: No   Sexual activity: Not on file

## 2019-10-26 ENCOUNTER — Encounter: Payer: Self-pay | Admitting: Family

## 2019-10-26 ENCOUNTER — Other Ambulatory Visit: Payer: Self-pay

## 2019-10-26 ENCOUNTER — Other Ambulatory Visit (HOSPITAL_COMMUNITY): Payer: Self-pay | Admitting: Family

## 2019-10-26 ENCOUNTER — Ambulatory Visit (HOSPITAL_COMMUNITY)
Admission: RE | Admit: 2019-10-26 | Discharge: 2019-10-26 | Disposition: A | Discharge status: Home / Self Care | Payer: Medicare Other | Source: Ambulatory Visit | Attending: Family | Admitting: Family

## 2019-10-26 ENCOUNTER — Ambulatory Visit (INDEPENDENT_AMBULATORY_CARE_PROVIDER_SITE_OTHER): Payer: Medicare Other | Admitting: Family

## 2019-10-26 VITALS — BP 168/83 | HR 73 | Temp 97.3°F | Resp 16 | Ht 64.0 in | Wt 237.0 lb

## 2019-10-26 DIAGNOSIS — I82409 Acute embolism and thrombosis of unspecified deep veins of unspecified lower extremity: Secondary | ICD-10-CM

## 2019-10-26 DIAGNOSIS — I82402 Acute embolism and thrombosis of unspecified deep veins of left lower extremity: Secondary | ICD-10-CM | POA: Diagnosis not present

## 2019-10-26 NOTE — Progress Notes (Signed)
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   History of Present Illness  Lindsey Williams is a 73 y.o. 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  She has used knee high compression hose in the past.  By her description, it sounds like she has had bilateral greater saphenous vein ablations (both legs), about 2018.  She states that ever since then she has has pain and "hard knots" in both calves.   She has had bilateral knee replacements.  She sees Dr. Fabio NeighborsB. Zachary, [place] in [place], [place], states she has a heart murmur, and some irregular heartbeats.   09[facility]30-19 [facility], [facility], [place], [place]  You have a new diagnosis of a deep venous thrombosis also known as a DVT of your left leg in the vein in your calf muscle. This requires blood thinners which you have taken before. The medication Xarelto   10-09-19, Dr. Janeece FittingEggleston-Clark 1. Pt anticoagulated for DVT. She has had h/o DVT in the past. Needs to see Vascular/Vein clinic for recommendations regarding long-term anticoagulation, as well as management of chronic leg pain.  For now, continue current anticoagulation started by ED. Low salt diet. Compression socks. Weight loss.  2. Poorly controlled HTN. Pt not tolerating ARB. Advised to d/c ARB; titrate metoprolol as directed. Call clinic with BP report in 1-2 weeks. Pt aware that ACEi or ARB recommended by ADA for renal protection. Consider restarting ARB next OV given pt has tolerated in the past.  3. Refer tonew Cards MD for management per pt request.  4-5. Refer to Vein clinic for DVT assessment and recommendations.      Past Medical History:   Diagnosis Date  . Chronic kidney disease    BLADDER CANCER - FOLLOWED YEARLY  . Diabetes mellitus    "PRE DIABETIC" DIET CONTROLLED  . Dysrhythmia    UNK ARRYTHMIA   . Hypertension   . Nausea & vomiting   . No pertinent past medical history CONSTIPATION AND EPIGASTRIC PAIN   EPIGASTRIC PAIN  . Osteoarthritis of left knee 10/07/2011  . Pneumonia    HX PE 1972 SUPERFICIAL PHLEBITIS 2010    Past Surgical History:  Procedure Laterality Date  . ABDOMINAL HYSTERECTOMY     1989  . ARCUATE KERATECTOMY    . BREAST BIOPSY     BIL  . CARDIAC CATHETERIZATION     8 YRS AGO/ STRESS ECHO 8 YRS AGO  . CARPAL TUNNEL RELEASE     BILATERAL  . CHOLECYSTECTOMY    . CYSTOSCOPY     YEARLY  . HEMORROIDECTOMY    . INGUINAL HERNIA REPAIR    . JOINT REPLACEMENT     RT TOTAL KNEE 2011  . KNEE ARTHROPLASTY  10/05/2011   Procedure: COMPUTER ASSISTED TOTAL KNEE ARTHROPLASTY;  Surgeon: Kathryne Hitchhristopher Y Blackman;  Location: [facility];  Service: Orthopedics;  Laterality: Left;  preop femoral nerve block  . NONE  09/26/11   NO PREVIOUS SURG  . NONE    . ULNAR NERVE REPAIR     BIL  . WRIST ARTHROSCOPY     BIL    Social History   Socioeconomic History  . Marital status: Single  Spouse name: Not on file  . Number of children: Not on file  . Years of education: Not on file  . Highest education level: Not on file  Occupational History  . Not on file  Social Needs  . Financial resource strain: Not on file  . Food insecurity    Worry: Not on file    Inability: Not on file  . Transportation needs    Medical: Not on file    Non-medical: Not on file  Tobacco Use  . Smoking status: Never Smoker  . Smokeless tobacco: Never Used  Substance and Sexual Activity  . Alcohol use: No  . Drug use: No  . Sexual activity: Not on file  Lifestyle  . Physical activity    Days per week: Not on file    Minutes per session: Not on file  . Stress: Not on file  Relationships  . Social Musician on  phone: Not on file    Gets together: Not on file    Attends religious service: Not on file    Active member of club or organization: Not on file    Attends meetings of clubs or organizations: Not on file    Relationship status: Not on file  . Intimate partner violence    Fear of current or ex partner: Not on file    Emotionally abused: Not on file    Physically abused: Not on file    Forced sexual activity: Not on file  Other Topics Concern  . Not on file  Social History Narrative  . Not on file    History reviewed. No pertinent family history.  Current Outpatient Medications on File Prior to Visit  Medication Sig Dispense Refill  . ALPRAZolam (XANAX) 0.5 MG tablet     . aspirin EC 81 MG tablet Take by mouth.    . calcium-vitamin D (OSCAL WITH D) 500-200 MG-UNIT per tablet Take 0.5 tablets by mouth daily.      . Cholecalciferol (D-3-5) 5000 units capsule Take by mouth.    . cimetidine (TAGAMET) 300 MG tablet TAKE 1 TABLET BY MOUTH TWICE DAILY GENERIC FOR TAGAMET    . ibuprofen (ADVIL) 800 MG tablet Take 1 tablet (800 mg total) by mouth 2 (two) times daily as needed. 180 tablet 0  . losartan-hydrochlorothiazide (HYZAAR) 100-12.5 MG tablet Take 1 tablet by mouth daily.    . metFORMIN (GLUCOPHAGE) 500 MG tablet Take by mouth.    . metoprolol (TOPROL-XL) 50 MG 24 hr tablet Take 50 mg by mouth every evening.      . pantoprazole (PROTONIX) 40 MG tablet Take by mouth.    . rivaroxaban (XARELTO) 10 MG TABS tablet Take 1 tablet (10 mg total) by mouth daily. 7 tablet 0  . valsartan-hydrochlorothiazide (DIOVAN-HCT) 320-12.5 MG per tablet Take 1 tablet by mouth every evening.       No current facility-administered medications on file prior to visit.     Allergies  Allergen Reactions  . Erythromycin Shortness Of Breath and Other (See Comments)    Had purple spots on body  . Tape     No paper tape or adhesive tape  . Epinephrine   . Losartan Other (See Comments)    Cough  . Other      Pain medications    REVIEW OF SYSTEMS: Cardiovascular: No chest pain, chest pressure, +palpitations, +orthopnea, + dyspnea on exertion. No claudication or rest pain,  +history of DVT and phlebitis. Pulmonary: No  productive cough, asthma or wheezing. Neurologic: No weakness, paresthesias, aphasia, or amaurosis. No dizziness. Hematologic: No bleeding problems or clotting disorders. Musculoskeletal: No joint pain or joint swelling. + bilateral knee pain prior to bilateral TKR in 2011 and 2012.  Gastrointestinal: No blood in stool or hematemesis. + GERD Genitourinary: No dysuria or hematuria. Psychiatric:: No history of major depression. Integumentary: No rashes or ulcers. Constitutional: No fever or chills.  Physical Examination Vitals:   10/26/19 1348  BP: (!) 168/83  Pulse: 73  Resp: 16  Temp: (!) 97.3 F (36.3 C)  TempSrc: Temporal  SpO2: 99%  Weight: 237 lb (107.5 kg)  Height: 5\' 4"  (1.626 m)   Body mass index is 40.68 kg/m.  PHYSICAL EXAMINATION: General: Morbidly obese female in NAD HEENT:  No gross abnormalities Pulmonary: Respirations are non-labored, adequate air movement in all fields, no rales, rhonchi, or wheezes Abdomen: Soft and non-tender with with normal bowel sounds. Musculoskeletal: There are no major deformities.   Neurologic: No focal weakness or paresthesias are detected, muscle strength is 5/5 in all extremities Skin: There are no ulcer or rashes noted. Psychiatric: The patient has normal affect. Cardiovascular: There is a regular rate and rhythm with low grade murmur appreciated.   Vascular: Vessel Right Left  Radial Palpable Palpable  Carotid  without bruit  without bruit  Aorta Not palpable N/A  Popliteal Not palpable Not palpable  PT Not Palpable Not Palpable  DP Palpable Palpable   Non-Invasive Vascular Imaging  BLE Venous Duplex (Date: 10/26/2019):  Summary: Right: There is no evidence of deep vein thrombosis in the lower extremity.  Left: Findings consistent with age indeterminate superficial vein thrombosis involving the left small sahenous vein at the mid calf. There is no evidence of deep vein thrombosis in the lower extremity.    Other facility Documentation  4 pages of office notes from Dr. Garwin BrothersCarlis Abbott were reviewed including: no venous imaging results.  Medical Decision Making  Lindsey Williams is a 73 y.o. 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   No evidence of DVT in either lower extremity on venous duplex today. No access to imaging or venous duplex from and ED visit in [place], notes indicate DVT found in left leg.  I discussed with Dr. Trula Slade pt pertinent HPI, physical exam results, and bilateral lower extremity venous duplex from today indicating no DVT.  The guidelines for DVT anticoagulation indicate 3 months of anticicoagulation.  VVS does not manage chronic pain.  For pt's PCP consideration: clotting disorder work up.   Pt advised to wear thigh high compression hose daily, donn every morning, remove at bedtime.    Follow up as needed.   Clemon Chambers, RN, MSN, FNP-C [facility] Office: [phone]  10/26/2019, 1:55 PM  Clinic MD: Trula Slade

## 2019-10-26 NOTE — Patient Instructions (Signed)
Deep Vein Thrombosis  Deep vein thrombosis (DVT) is a condition in which a blood clot forms in a deep vein, such as a lower leg, thigh, or arm vein. A clot is blood that has thickened into a gel or solid. This condition is dangerous. It can lead to serious and even life-threatening complications if the clot travels to the lungs and causes a blockage (pulmonary embolism). It can also damage veins in the leg. This can result in leg pain, swelling, discoloration, and sores (post-thrombotic syndrome). What are the causes? This condition may be caused by:  A slowdown of blood flow.  Damage to a vein.  A condition that causes blood to clot more easily, such as an inherited clotting disorder. What increases the risk? The following factors may make you more likely to develop this condition:  Being overweight.  Being older, especially over age 60.  Sitting or lying down for more than four hours.  Being in the hospital.  Lack of physical activity (sedentary lifestyle).  Pregnancy, being in childbirth, or having recently given birth.  Taking medicines that contain estrogen, such as medicines to prevent pregnancy.  Smoking.  A history of any of the following: ? Blood clots or a blood clotting disease. ? Peripheral vascular disease. ? Inflammatory bowel disease. ? Cancer. ? Heart disease. ? Genetic conditions that affect how your blood clots, such as Factor V Leiden mutation. ? Neurological diseases that affect your legs (leg paresis). ? A recent injury, such as a car accident. ? Major or lengthy surgery. ? A central line placed inside a large vein. What are the signs or symptoms? Symptoms of this condition include:  Swelling, pain, or tenderness in an arm or leg.  Warmth, redness, or discoloration in an arm or leg. If the clot is in your leg, symptoms may be more noticeable or worse when you stand or walk. Some people may not develop any symptoms. How is this diagnosed? This  condition is diagnosed with:  A medical history and physical exam.  Tests, such as: ? Blood tests. These are done to check how well your blood clots. ? Ultrasound. This is done to check for clots. ? Venogram. For this test, contrast dye is injected into a vein and X-rays are taken to check for any clots. How is this treated? Treatment for this condition depends on:  The cause of your DVT.  Your risk for bleeding or developing more clots.  Any other medical conditions that you have. Treatment may include:  Taking a blood thinner (anticoagulant). This type of medicine prevents clots from forming. It may be taken by mouth, injected under the skin, or injected through an IV (catheter).  Injecting clot-dissolving medicines into the affected vein (catheter-directed thrombolysis).  Having surgery. Surgery may be done to: ? Remove the clot. ? Place a filter in a large vein to catch blood clots before they reach the lungs. Some treatments may be continued for up to six months. Follow these instructions at home: If you are taking blood thinners:  Take the medicine exactly as told by your health care provider. Some blood thinners need to be taken at the same time every day. Do not skip a dose.  Talk with your health care provider before you take any medicines that contain aspirin or NSAIDs. These medicines increase your risk for dangerous bleeding.  Ask your health care provider about foods and drugs that could change the way the medicine works (may interact). Avoid those things if your   health care provider tells you to do so.  Blood thinners can cause easy bruising and may make it difficult to stop bleeding. Because of this: ? Be very careful when using knives, scissors, or other sharp objects. ? Use an electric razor instead of a blade. ? Avoid activities that could cause injury or bruising, and follow instructions about how to prevent falls.  Wear a medical alert bracelet or carry a  card that lists what medicines you take. General instructions  Take over-the-counter and prescription medicines only as told by your health care provider.  Return to your normal activities as told by your health care provider. Ask your health care provider what activities are safe for you.  Wear compression stockings if recommended by your health care provider.  Keep all follow-up visits as told by your health care provider. This is important. How is this prevented? To lower your risk of developing this condition again:  For 30 or more minutes every day, do an activity that: ? Involves moving your arms and legs. ? Increases your heart rate.  When traveling for longer than four hours: ? Exercise your arms and legs every hour. ? Drink plenty of water. ? Avoid drinking alcohol.  Avoid sitting or lying for a long time without moving your legs.  If you have surgery or you are hospitalized, ask about ways to prevent blood clots. These may include taking frequent walks or using anticoagulants.  Stay at a healthy weight.  If you are a woman who is older than age 72, avoid unnecessary use of medicines that contain estrogen, such as some birth control pills.  Do not use any products that contain nicotine or tobacco, such as cigarettes and e-cigarettes. This is especially important if you take estrogen medicines. If you need help quitting, ask your health care provider. Contact a health care provider if:  You miss a dose of your blood thinner.  Your menstrual period is heavier than usual.  You have unusual bruising. Get help right away if:  You have: ? New or increased pain, swelling, or redness in an arm or leg. ? Numbness or tingling in an arm or leg. ? Shortness of breath. ? Chest pain. ? A rapid or irregular heartbeat. ? A severe headache or confusion. ? A cut that will not stop bleeding.  There is blood in your vomit, stool, or urine.  You have a serious fall or accident,  or you hit your head.  You feel light-headed or dizzy.  You cough up blood. These symptoms may represent a serious problem that is an emergency. Do not wait to see if the symptoms will go away. Get medical help right away. Call your local emergency services (911 in the [place] Do not drive yourself to the hospital. Summary  Deep vein thrombosis (DVT) is a condition in which a blood clot forms in a deep vein, such as a lower leg, thigh, or arm vein.  Symptoms can include swelling, warmth, pain, and redness in your leg or arm.  This condition may be treated with a blood thinner (anticoagulant medicine), medicine that is injected to dissolve blood clots,compression stockings, or surgery.  If you are prescribed blood thinners, take them exactly as told. This information is not intended to replace advice given to you by your health care provider. Make sure you discuss any questions you have with your health care provider. Document Released: 11/12/2005 Document Revised: 10/25/2017 Document Reviewed: 04/12/2017 Elsevier Patient Education  2020 [employer]  How to Use Compression Stockings Compression stockings are elastic socks that squeeze the legs. They help increase blood flow (circulation) to the legs, decrease swelling in the legs, and reduce the chance of developing blood clots in the lower legs. Compression stockings are often used by people who:  Are recovering from surgery.  Have poor circulation in their legs.  Tend to get blood clots in their legs.  Have bulging (varicose) veins.  Sit or stay in bed for long periods of time. Follow instructions from your health care provider about how and when to wear your compression stockings. How to wear compression stockings Before you put on your compression stockings:  Make sure that they are the correct size and degree of compression. If you do not know your size or required grade of compression, ask your health care provider and  follow the manufacturer's instructions that come with the stockings.  Make sure that they are clean, dry, and in good condition.  Check them for rips and tears. Do not put them on if they are ripped or torn. Put your stockings on first thing in the morning, before you get out of bed. Keep them on for as long as your health care provider advises. When you are wearing your stockings:  Keep them as smooth as possible. Do not allow them to bunch up. It is especially important to prevent the stockings from bunching up around your toes or behind your knees.  Do not roll the stockings downward and leave them rolled down. This can decrease blood flow to your leg.  Change them right away if they become wet or dirty. When you take off your stockings, inspect your legs and feet. Check for:  Open sores.  Red spots.  Swelling. General tips  Do not stop wearing compression stockings without talking to your health care provider first.  Wash your stockings every day with mild detergent in cold or warm water. Do not use bleach. Air-dry your stockings or dry them in a clothes dryer on low heat. It may be helpful to have two pairs so that you have a pair to wear while the other is being washed.  Replace your stockings every 3-6 months.  If skin moisturizing is part of your treatment plan, apply lotion or cream at night so that your skin will be dry when you put on the stockings in the morning. It is harder to put the stockings on when you have lotion on your legs or feet.  Wear nonskid shoes or slip-resistant socks when walking while wearing compression stockings. Contact a health care provider and remove your stockings if you have:  A feeling of pins and needles in your feet or legs.  Open sores, red spots, or other skin changes on your feet or legs.  Swelling or pain that gets worse. Get help right away if you have:  Numbness or tingling in your lower legs that does not get better right after  you take the stockings off.  Toes or feet that are unusually cold or turn a bluish color.  A warm or red area on your leg.  New swelling or soreness in your leg.  Shortness of breath.  Chest pain.  A fast or irregular heartbeat.  Light-headedness.  Dizziness. Summary  Compression stockings are elastic socks that squeeze the legs.  They help increase blood flow (circulation) to the legs, decrease swelling in the legs, and reduce the chance of developing blood clots in the lower legs.  Follow  instructions from your health care provider about how and when to wear your compression stockings.  Do not stop wearing your compression stockings without talking to your health care provider first. This information is not intended to replace advice given to you by your health care provider. Make sure you discuss any questions you have with your health care provider. Document Released: 09/09/2009 Document Revised: 11/14/2017 Document Reviewed: 11/14/2017 Elsevier Patient Education  2020 [employer].

## 2019-12-14 ENCOUNTER — Telehealth (HOSPITAL_COMMUNITY): Payer: Self-pay | Admitting: Vascular Surgery

## 2019-12-14 NOTE — Telephone Encounter (Signed)
Left pt message to make new pt appt 

## 2019-12-21 ENCOUNTER — Other Ambulatory Visit (HOSPITAL_COMMUNITY): Payer: Self-pay

## 2019-12-21 DIAGNOSIS — I5022 Chronic systolic (congestive) heart failure: Secondary | ICD-10-CM

## 2020-01-26 ENCOUNTER — Telehealth (HOSPITAL_COMMUNITY): Payer: Self-pay | Admitting: Vascular Surgery

## 2020-01-26 NOTE — Telephone Encounter (Signed)
PT canceled new pt appt w. Db w/ echo, pt states she saw another cardiologist

## 2020-01-28 ENCOUNTER — Encounter (HOSPITAL_COMMUNITY): Payer: BC Managed Care – PPO | Admitting: Internal Medicine

## 2020-01-28 ENCOUNTER — Ambulatory Visit (HOSPITAL_COMMUNITY): Payer: BC Managed Care – PPO

## 2020-03-16 ENCOUNTER — Ambulatory Visit (INDEPENDENT_AMBULATORY_CARE_PROVIDER_SITE_OTHER): Payer: Medicare Other | Admitting: Orthopaedic Surgery

## 2020-03-16 ENCOUNTER — Other Ambulatory Visit: Payer: Self-pay

## 2020-03-16 ENCOUNTER — Encounter: Payer: Self-pay | Admitting: Orthopaedic Surgery

## 2020-03-16 DIAGNOSIS — M25561 Pain in right knee: Secondary | ICD-10-CM | POA: Diagnosis not present

## 2020-03-16 DIAGNOSIS — G8929 Other chronic pain: Secondary | ICD-10-CM | POA: Diagnosis not present

## 2020-03-16 DIAGNOSIS — Z96651 Presence of right artificial knee joint: Secondary | ICD-10-CM | POA: Insufficient documentation

## 2020-03-16 DIAGNOSIS — Z96652 Presence of left artificial knee joint: Secondary | ICD-10-CM | POA: Insufficient documentation

## 2020-03-16 MED ORDER — METHYLPREDNISOLONE ACETATE 40 MG/ML IJ SUSP
40.0000 mg | INTRAMUSCULAR | Status: AC | PRN
  Administered 2020-03-16: 40 mg via INTRA_ARTICULAR

## 2020-03-16 MED ORDER — LIDOCAINE HCL 1 % IJ SOLN
1.0000 mL | INTRAMUSCULAR | Status: AC | PRN
  Administered 2020-03-16: 10:00:00 1 mL

## 2020-03-16 NOTE — Progress Notes (Signed)
Office Visit Note   [name]           Date of Birth: 1945/12/01           MRN: [id number] Visit Date: 03/16/2020              Requested by: Phylliss Bob, MD [address] [place],  [place] [address] PCP: Phylliss Bob, MD   Assessment & Plan: Visit Diagnoses:  1. Chronic pain of right knee   2. History of total right knee replacement   3. History of total left knee replacement     Plan: I do feel that it is reasonable to try a steroid injection in the area of maximum point tenderness of the medial aspect of her right knee and explained this to her.  She is agreeable to this given the fact that there is no other treatments that she can try right now as she continues to recover from COVID-19 and is on blood thinning medications.  I did place a steroid injection in the point of tenderness of her medial aspect of her knee without difficulty.  Also want her to try Voltaren gel which has a better safety profile for someone who cannot be on oral anti-inflammatories.  All questions and concerns were answered and addressed.  Follow-up as needed.  If the knee continues to bother her she needs to come back and see [place] and we will x-ray the knee again and go from there.  Follow-Up Instructions: Return if symptoms worsen or fail to improve.   Orders:  Orders Placed This Encounter  Procedures  . Large Joint Inj   No orders of the defined types were placed in this encounter.     Procedures: Large Joint Inj: R knee on 03/16/2020 9:34 AM Indications: diagnostic evaluation and pain Details: 22 G 1.5 in needle, superolateral approach  Arthrogram: No  Medications: 40 mg methylPREDNISolone acetate 40 MG/ML; 1 mL lidocaine 1 % Outcome: tolerated well, no immediate complications Procedure, treatment alternatives, risks and benefits explained, specific risks discussed. Consent was given by the patient. Immediately prior to procedure a time out was called to verify the correct patient,  procedure, equipment, support staff and site/side marked as required. Patient was prepped and draped in the usual sterile fashion.       Clinical Data: No additional findings.   Subjective: Chief Complaint  Patient presents with  . Right Knee - Pain  The patient is a 74 year old female well-known to me.  She has remote history of both her knees being replaced.  About 6 weeks ago she started developed right knee pain and she points the medial joint line.  She denies any injury.  We actually x-rayed her right knee in July of last year and showed no acute findings with the knee replacement itself.  She did develop a blood clot in her left leg in November 2020.  She is on Xarelto.  She also developed COVID-19 in December and she is having a hard time getting over that.  She is someone who is obese.  She is not a diabetic.  HPI  Review of Systems Today she denies any fever, chills, nausea, vomiting  Objective: Vital Signs: There were no vitals taken for this visit.  Physical Exam She is alert and orient x3 and in no acute distress Ortho Exam Examination of her right knee shows a well-healed surgical incision with good range of motion of her right knee.  The knee feels ligamentously stable.  She has tenderness over the medial joint line and the pes bursa area of her knee.  There is no redness and no effusion. Specialty Comments:  No specialty comments available.  Imaging: No results found.   PMFS History: Patient Active Problem List   Diagnosis Date Noted  . History of total left knee replacement 03/16/2020  . History of total right knee replacement 03/16/2020  . Osteoarthritis of left knee 10/07/2011   Past Medical History:  Diagnosis Date  . Chronic kidney disease    BLADDER CANCER - FOLLOWED YEARLY  . Diabetes mellitus    "PRE DIABETIC" DIET CONTROLLED  . Dysrhythmia    UNK ARRYTHMIA   . Hypertension   . Nausea & vomiting   . No pertinent past medical history  CONSTIPATION AND EPIGASTRIC PAIN   EPIGASTRIC PAIN  . Osteoarthritis of left knee 10/07/2011  . Pneumonia    HX PE 1972 SUPERFICIAL PHLEBITIS 2010    History reviewed. No pertinent family history.  Past Surgical History:  Procedure Laterality Date  . ABDOMINAL HYSTERECTOMY     1989  . ARCUATE KERATECTOMY    . BREAST BIOPSY     BIL  . CARDIAC CATHETERIZATION     8 YRS AGO/ STRESS ECHO 8 YRS AGO  . CARPAL TUNNEL RELEASE     BILATERAL  . CHOLECYSTECTOMY    . CYSTOSCOPY     YEARLY  . HEMORROIDECTOMY    . INGUINAL HERNIA REPAIR    . JOINT REPLACEMENT     RT TOTAL KNEE 2011  . KNEE ARTHROPLASTY  10/05/2011   Procedure: COMPUTER ASSISTED TOTAL KNEE ARTHROPLASTY;  Surgeon: Mcarthur Rossetti;  Location: [facility];  Service: Orthopedics;  Laterality: Left;  preop femoral nerve block  . NONE  09/26/11   NO PREVIOUS SURG  . NONE    . ULNAR NERVE REPAIR     BIL  . WRIST ARTHROSCOPY     BIL   Social History   Occupational History  . Not on file  Tobacco Use  . Smoking status: Never Smoker  . Smokeless tobacco: Never Used  Substance and Sexual Activity  . Alcohol use: No  . Drug use: No  . Sexual activity: Not on file

## 2021-03-29 ENCOUNTER — Ambulatory Visit (INDEPENDENT_AMBULATORY_CARE_PROVIDER_SITE_OTHER): Payer: Medicare Other | Admitting: Orthopaedic Surgery

## 2021-03-29 ENCOUNTER — Encounter: Payer: Self-pay | Admitting: Orthopaedic Surgery

## 2021-03-29 DIAGNOSIS — M76821 Posterior tibial tendinitis, right leg: Secondary | ICD-10-CM | POA: Diagnosis not present

## 2021-03-29 DIAGNOSIS — M722 Plantar fascial fibromatosis: Secondary | ICD-10-CM

## 2021-03-29 MED ORDER — METHYLPREDNISOLONE 4 MG PO TABS: ORAL_TABLET | ORAL | 0 refills | Status: DC

## 2021-03-29 NOTE — Progress Notes (Signed)
Office Visit Note   [name]           Date of Birth: 1946/02/17           MRN: [id number] Visit Date: 03/29/2021              Requested by: Ave Filter, MD [address] [place],  [place] [address] PCP: Ave Filter, MD   Assessment & Plan: Visit Diagnoses:  1. Plantar fasciitis of right foot   2. Posterior tibial tendinitis, right leg     Plan: Patient is on Xarelto she is unable to take NSAIDs.  Therefore at her request we will place her on a Medrol dose pack.  She is to watch her glucose levels closely.  Offered her formal physical therapy she defers.  Therefore some stretching exercises and exercises to strengthen the right posterior tibial tendon are reviewed with her.  Also reviewed shoe wear with her.  She still avoid open back shoes.  She also needs a shoe with a good arch support.  Recommend she get back to using her inserts as long as they are pliable in both shoes.  Questions encouraged and answered at length by Dr. Magnus Ivan myself.  She will follow-up with [place] as needed or if pain persist or becomes worse.  Follow-Up Instructions: Return if symptoms worsen or fail to improve.   Orders:  No orders of the defined types were placed in this encounter.  Meds ordered this encounter  Medications  . methylPREDNISolone (MEDROL) 4 MG tablet    Sig: Take as directed    Dispense:  21 tablet    Refill:  0      Procedures: No procedures performed   Clinical Data: No additional findings.   Subjective: Chief Complaint  Patient presents with  . Right Foot - Pain    HPI  Ms. Fenn is well-known to Dr. Magnus Ivan service comes in today with right foot pain that started 2 weeks ago.  She states she had a cramp in her foot try to put her foot down flat on the floor had spasming.  She points to the medial aspect of her calcaneus as the source of her pain.  Also notes that there is a slight pink spot over this area.  She is also had some right heel  pain.  No particular injury.  She is improving she is no longer having any spasms.  She has had no treatment for this.  She is diabetic and reports hemoglobin A1c is 5.5.  She comes in today and open back shoes.  She notes she wears a lot of flip-flops around the house.  Does wear a sneaker for walking exercise.  Has inserts for her shoes but has not been wearing them.  Review of Systems  Constitutional: Negative for chills and fever.  See HPI   Objective: Vital Signs: There were no vitals taken for this visit.  Physical Exam Constitutional:      Appearance: She is not ill-appearing or diaphoretic.  Pulmonary:     Effort: Pulmonary effort is normal.  Neurological:     Mental Status: She is alert and oriented to person, place, and time.  Psychiatric:        Mood and Affect: Mood normal.     Ortho Exam Bilateral feet sensation intact except for subjective decreased sensation over the medial border of the great toe on the right.  Dorsal pedal pulses are present bilaterally.  No rashes  skin lesions ulcerations erythema of either foot.  Slight too many toes sign on the right negative on the left.  She is able to do single heel raise easily on the left difficult to perform on the right.  5 out of 5 strength with inversion eversion against resistance bilateral feet however inversion of the right foot causes discomfort medial aspect of the calcaneus region.  She is tender over the right posterior tibial tendon.  No tenderness over the peroneal tendons bilaterally.  Tenderness over the medial tubercle of the calcaneus right greater than left.  Nontender over the Achilles bilaterally.  Achilles is intact bilaterally. Specialty Comments:  No specialty comments available.  Imaging: No results found.   PMFS History: Patient Active Problem List   Diagnosis Date Noted  . History of total left knee replacement 03/16/2020  . History of total right knee replacement 03/16/2020  . Osteoarthritis of  left knee 10/07/2011   Past Medical History:  Diagnosis Date  . Chronic kidney disease    BLADDER CANCER - FOLLOWED YEARLY  . Diabetes mellitus    "PRE DIABETIC" DIET CONTROLLED  . Dysrhythmia    UNK ARRYTHMIA   . Hypertension   . Nausea & vomiting   . No pertinent past medical history CONSTIPATION AND EPIGASTRIC PAIN   EPIGASTRIC PAIN  . Osteoarthritis of left knee 10/07/2011  . Pneumonia    HX PE 1972 SUPERFICIAL PHLEBITIS 2010    History reviewed. No pertinent family history.  Past Surgical History:  Procedure Laterality Date  . ABDOMINAL HYSTERECTOMY     1989  . ARCUATE KERATECTOMY    . BREAST BIOPSY     BIL  . CARDIAC CATHETERIZATION     8 YRS AGO/ STRESS ECHO 8 YRS AGO  . CARPAL TUNNEL RELEASE     BILATERAL  . CHOLECYSTECTOMY    . CYSTOSCOPY     YEARLY  . HEMORROIDECTOMY    . INGUINAL HERNIA REPAIR    . JOINT REPLACEMENT     RT TOTAL KNEE 2011  . KNEE ARTHROPLASTY  10/05/2011   Procedure: COMPUTER ASSISTED TOTAL KNEE ARTHROPLASTY;  Surgeon: Kathryne Hitch;  Location: [facility];  Service: Orthopedics;  Laterality: Left;  preop femoral nerve block  . NONE  09/26/11   NO PREVIOUS SURG  . NONE    . ULNAR NERVE REPAIR     BIL  . WRIST ARTHROSCOPY     BIL   Social History   Occupational History  . Not on file  Tobacco Use  . Smoking status: Never Smoker  . Smokeless tobacco: Never Used  Substance and Sexual Activity  . Alcohol use: No  . Drug use: No  . Sexual activity: Not on file

## 2022-05-21 ENCOUNTER — Ambulatory Visit (INDEPENDENT_AMBULATORY_CARE_PROVIDER_SITE_OTHER): Payer: Medicare Other

## 2022-05-21 ENCOUNTER — Ambulatory Visit (INDEPENDENT_AMBULATORY_CARE_PROVIDER_SITE_OTHER): Payer: Medicare Other | Admitting: Orthopaedic Surgery

## 2022-05-21 DIAGNOSIS — G8929 Other chronic pain: Secondary | ICD-10-CM

## 2022-05-21 DIAGNOSIS — M25511 Pain in right shoulder: Secondary | ICD-10-CM

## 2022-05-21 DIAGNOSIS — M25521 Pain in right elbow: Secondary | ICD-10-CM

## 2022-05-21 MED ORDER — LIDOCAINE HCL 1 % IJ SOLN
3.0000 mL | INTRAMUSCULAR | Status: AC | PRN
  Administered 2022-05-21: 3 mL

## 2022-05-21 MED ORDER — METHYLPREDNISOLONE ACETATE 40 MG/ML IJ SUSP
40.0000 mg | INTRAMUSCULAR | Status: AC | PRN
  Administered 2022-05-21: 40 mg

## 2022-05-21 MED ORDER — LIDOCAINE HCL 1 % IJ SOLN
2.0000 mL | INTRAMUSCULAR | Status: AC | PRN
  Administered 2022-05-21: 2 mL

## 2022-05-21 MED ORDER — METHYLPREDNISOLONE ACETATE 40 MG/ML IJ SUSP
40.0000 mg | INTRAMUSCULAR | Status: AC | PRN
  Administered 2022-05-21: 40 mg via INTRA_ARTICULAR

## 2022-06-05 ENCOUNTER — Ambulatory Visit (INDEPENDENT_AMBULATORY_CARE_PROVIDER_SITE_OTHER): Payer: Medicare Other | Admitting: Orthopaedic Surgery

## 2022-06-05 ENCOUNTER — Encounter: Payer: Self-pay | Admitting: Orthopaedic Surgery

## 2022-06-05 DIAGNOSIS — M25511 Pain in right shoulder: Secondary | ICD-10-CM

## 2022-06-05 DIAGNOSIS — G8929 Other chronic pain: Secondary | ICD-10-CM

## 2022-06-05 DIAGNOSIS — M25521 Pain in right elbow: Secondary | ICD-10-CM

## 2022-06-05 DIAGNOSIS — M25512 Pain in left shoulder: Secondary | ICD-10-CM

## 2022-06-05 MED ORDER — GABAPENTIN 300 MG PO CAPS: 300.0000 mg | ORAL_CAPSULE | Freq: Every day | ORAL | 1 refills | Status: DC

## 2022-06-05 MED ORDER — LIDOCAINE HCL 1 % IJ SOLN
3.0000 mL | INTRAMUSCULAR | Status: AC | PRN
  Administered 2022-06-05: 3 mL

## 2022-06-05 MED ORDER — METHYLPREDNISOLONE ACETATE 40 MG/ML IJ SUSP
40.0000 mg | INTRAMUSCULAR | Status: AC | PRN
  Administered 2022-06-05: 40 mg via INTRA_ARTICULAR

## 2022-06-05 MED ORDER — METHOCARBAMOL 500 MG PO TABS: 500.0000 mg | ORAL_TABLET | Freq: Four times a day (QID) | ORAL | 1 refills | Status: DC | PRN

## 2022-06-05 NOTE — Progress Notes (Signed)
Office Visit Note   Patient: Evania Lyne           Date of Birth: Jan 08, 1946           MRN: [id number] Visit Date: 06/05/2022              Requested by: Ave Filter, MD [address] [place],  [place] [address] PCP: Ave Filter, MD   Assessment & Plan: Visit Diagnoses:  1. Chronic right shoulder pain   2. Chronic left shoulder pain   3. Pain in right elbow     Plan:   Per the patient's request I did provide a steroid injection in her left shoulder subacromial outlet which she tolerated well.  Since she is having some spasms I will try a safer medication which I think will be Robaxin 500 mg to take as needed.  Also would like to start her on gabapentin 300 mg just at bedtime.  I would like to reevaluate her in 6 weeks but no x-rays are needed.  Follow-Up Instructions: Return in about 6 weeks (around 07/17/2022).   Orders:  Orders Placed This Encounter  Procedures   Large Joint Inj   Meds ordered this encounter  Medications   methocarbamol (ROBAXIN) 500 MG tablet    Sig: Take 1 tablet (500 mg total) by mouth every 6 (six) hours as needed.    Dispense:  40 tablet    Refill:  1   gabapentin (NEURONTIN) 300 MG capsule    Sig: Take 1 capsule (300 mg total) by mouth at bedtime.    Dispense:  30 capsule    Refill:  1      Procedures: Large Joint Inj: L subacromial bursa on 06/05/2022 9:41 AM Indications: pain and diagnostic evaluation Details: 22 G 1.5 in needle  Arthrogram: No  Medications: 3 mL lidocaine 1 %; 40 mg methylPREDNISolone acetate 40 MG/ML Outcome: tolerated well, no immediate complications Procedure, treatment alternatives, risks and benefits explained, specific risks discussed. Consent was given by the patient. Immediately prior to procedure a time out was called to verify the correct patient, procedure, equipment, support staff and site/side marked as required. Patient was prepped and draped in the usual sterile fashion.        Clinical Data: No additional findings.   Subjective: Chief Complaint  Patient presents with   Right Shoulder - Follow-up   Right Elbow - Follow-up  The patient comes in today 2 weeks after I injected her right shoulder and her right elbow.  She said the shoulder injection treated all of her pain with her shoulder but the elbow is still somewhat painful.  She is requesting an injection in her left shoulder today because that starting to bother her.  She is diabetic but had no adverse effects thus far she states from the steroid injections in terms of the effects on her blood glucose.  She does have cramping in her legs at night.  She does not get restful sleep.  There is still radicular component of her pain as well.  HPI  Review of Systems There is currently listed no fever, chills, nausea, vomiting  Objective: Vital Signs: There were no vitals taken for this visit.  Physical Exam She is alert and orient x3 and in no acute distress Ortho Exam Examination of her right elbow shows still flexion and extension stiffness with global pain.  Her shoulder feels better overall and has better range of motion.  Left shoulder shows only  some signs of impingement.  There is no edema in her legs. Specialty Comments:  No specialty comments available.  Imaging: No results found.   PMFS History: Patient Active Problem List   Diagnosis Date Noted   History of total left knee replacement 03/16/2020   History of total right knee replacement 03/16/2020   Osteoarthritis of left knee 10/07/2011   Past Medical History:  Diagnosis Date   Chronic kidney disease    BLADDER CANCER - FOLLOWED YEARLY   Diabetes mellitus    "PRE DIABETIC" DIET CONTROLLED   Dysrhythmia    UNK ARRYTHMIA    Hypertension    Nausea & vomiting    No pertinent past medical history CONSTIPATION AND EPIGASTRIC PAIN   EPIGASTRIC PAIN   Osteoarthritis of left knee 10/07/2011   Pneumonia    HX PE 1972 SUPERFICIAL  PHLEBITIS 2010    History reviewed. No pertinent family history.  Past Surgical History:  Procedure Laterality Date   ABDOMINAL HYSTERECTOMY     1989   ARCUATE KERATECTOMY     BREAST BIOPSY     BIL   CARDIAC CATHETERIZATION     8 YRS AGO/ STRESS ECHO 8 YRS AGO   CARPAL TUNNEL RELEASE     BILATERAL   CHOLECYSTECTOMY     CYSTOSCOPY     YEARLY   HEMORROIDECTOMY     INGUINAL HERNIA REPAIR     JOINT REPLACEMENT     RT TOTAL KNEE 2011   KNEE ARTHROPLASTY  10/05/2011   Procedure: COMPUTER ASSISTED TOTAL KNEE ARTHROPLASTY;  Surgeon: Kathryne Hitch;  Location: [facility];  Service: Orthopedics;  Laterality: Left;  preop femoral nerve block   NONE  09/26/11   NO PREVIOUS SURG   NONE     ULNAR NERVE REPAIR     BIL   WRIST ARTHROSCOPY     BIL   Social History   Occupational History   Not on file  Tobacco Use   Smoking status: Never   Smokeless tobacco: Never  Substance and Sexual Activity   Alcohol use: No   Drug use: No   Sexual activity: Not on file

## 2022-07-11 ENCOUNTER — Ambulatory Visit: Payer: PRIVATE HEALTH INSURANCE | Admitting: Orthopaedic Surgery

## 2022-07-17 ENCOUNTER — Ambulatory Visit: Payer: PRIVATE HEALTH INSURANCE | Admitting: Orthopaedic Surgery

## 2022-08-01 ENCOUNTER — Ambulatory Visit (INDEPENDENT_AMBULATORY_CARE_PROVIDER_SITE_OTHER): Payer: Medicare Other | Admitting: Orthopaedic Surgery

## 2022-08-01 ENCOUNTER — Encounter: Payer: Self-pay | Admitting: Orthopaedic Surgery

## 2022-08-01 DIAGNOSIS — M25521 Pain in right elbow: Secondary | ICD-10-CM

## 2022-08-01 DIAGNOSIS — M25512 Pain in left shoulder: Secondary | ICD-10-CM

## 2022-08-01 DIAGNOSIS — M25511 Pain in right shoulder: Secondary | ICD-10-CM | POA: Diagnosis not present

## 2022-08-01 DIAGNOSIS — G8929 Other chronic pain: Secondary | ICD-10-CM | POA: Diagnosis not present

## 2022-08-01 NOTE — Progress Notes (Signed)
The patient is an active 76 year old well-known to me.  We have been seeing her recently for bilateral shoulder pain with the right worse than left.  She has had steroid injections in both subacromial outlets.  She gets a lot of popping with the right shoulder and she points to the anterior aspect of the shoulder as a source of her pain.  It does radiate down to her elbow.  She also has pain that wakes her up at night of both shoulders and sometimes feels like her arms get paralyzed.  She has tried anti-inflammatories topically and orally as well as Neurontin.  X-rays of both shoulders reviewed that were done or recent show no acute findings with well-maintained joint space.  Examination of both shoulder shows signs of impingement with good motion overall.  I would like to send her at this point outpatient physical therapy for any modalities per the therapist discretion they can help improve the strength and decrease the pain of her shoulders.  She agrees with this treatment plan.  This can be done in [place] where she is from.  I will then see her back in 6 weeks to see how she is doing overall.

## 2022-09-12 ENCOUNTER — Ambulatory Visit: Payer: PRIVATE HEALTH INSURANCE | Admitting: Orthopaedic Surgery

## 2022-09-14 ENCOUNTER — Telehealth: Payer: Self-pay | Admitting: Orthopaedic Surgery

## 2022-09-14 DIAGNOSIS — G8929 Other chronic pain: Secondary | ICD-10-CM

## 2022-09-14 NOTE — Telephone Encounter (Signed)
Patient advised she missed her PT appt, due to a misunderstanding and would like to have another rx for PT . Fax number--[phone], [facility].Marland Kitchen

## 2022-09-14 NOTE — Telephone Encounter (Signed)
Referral placed in chart, can you please call to schedule. Thank you

## 2022-09-16 ENCOUNTER — Encounter: Payer: Self-pay | Admitting: Orthopaedic Surgery

## 2022-09-18 ENCOUNTER — Telehealth: Payer: Self-pay | Admitting: Orthopaedic Surgery

## 2022-09-18 NOTE — Telephone Encounter (Signed)
Patient is stating that the [facility] has not gotten a referral for her therapy. Fax [phone] phone [phone]

## 2023-02-18 ENCOUNTER — Other Ambulatory Visit: Payer: Self-pay

## 2023-02-18 ENCOUNTER — Ambulatory Visit (INDEPENDENT_AMBULATORY_CARE_PROVIDER_SITE_OTHER): Payer: Medicare Other | Admitting: Orthopaedic Surgery

## 2023-02-18 ENCOUNTER — Encounter: Payer: Self-pay | Admitting: Orthopaedic Surgery

## 2023-02-18 DIAGNOSIS — M792 Neuralgia and neuritis, unspecified: Secondary | ICD-10-CM

## 2023-02-18 DIAGNOSIS — M25511 Pain in right shoulder: Secondary | ICD-10-CM

## 2023-02-18 DIAGNOSIS — M79601 Pain in right arm: Secondary | ICD-10-CM

## 2023-02-18 DIAGNOSIS — G8929 Other chronic pain: Secondary | ICD-10-CM | POA: Diagnosis not present

## 2023-02-18 NOTE — H&P (View-Only) (Signed)
  GI Office Note    Referring Provider: Zakhary, Boshra G, MD Primary Care Physician:  Zakhary, Boshra G, MD  Primary Gastroenterologist: Charles K. Carver, DO  Chief Complaint   Chief Complaint  Patient presents with   Gastroesophageal Reflux    Referred for GERD. Reports her cardiologist told her symptoms are from acid reflux. And also told she has hernia and treated with protonix for 4 -5 years. Reports she has had two swallow test in [place] and pt states it did not show reflux or hernia. Currently taking prilosec.     History of Present Illness   Lindsey Williams is a 77 y.o. female presenting today at the request of Zakhary, Boshra G, MD for GERD.   Appears patient had a colonoscopy/sigmoidoscopy in 2007 at [facility] but report not available.   Labs 07/24/22: CBC and CMP unremarkable. A1c 5  Per referral notes patient ports cough and epigastric discomfort at times as well as a burning sensation in her throat and her stomach for several years that is worse with spicy and greasy foods.  Also with morning nausea and a lot of belching.  Reportedly was taking Protonix 40 mg twice daily for years which did not seem to help.  Also having heart palpitations at times and felt that was causing her burning pain in her chest.  No reports an EGD and colonoscopy done in May 2019 for similar complaints and small hiatal hernia noted at that time by Dr. Beacham.  PCP had advised for her to start taking Prilosec 40 mg twice daily for couple weeks and adding Carafate and obtain upper GI series.  Advised to consider EGD pending these results.   UGI series October 2023 with frequent uncoordinated tertiary esophageal contractions.   Today: Reports she has always had palpitation and has been evaluated by cardiology who says everything is normal. Is usually triggered by caffeine so she stopped caffeine and it still occurs. She went to her PCP and old her that it would flutter and then stop and start  and was told it was possible reflux. Seen GI with [facility] in [place]. States she had an EGD and colonoscopy with Dr. Beacham and was told she had a small hiatal hernia and acid reflux. She reports she was put on Protonix at that time. States nothing needed to be done with hiatal hernia. Palpitations have continued despite the protonix. Because of this she went to see another cardiologist and did the same evaluation of her heart as Dr. Zackhary. Has had an echo, stress test, cardiac caths, and CTA as well. Did not tolerate Lipitor or Crestor. States her BP would go up as well. States cardiology out her on metformin due to mild elevated A1c and she was still having palpitations. Tried Ozempic and has been on that for 2 years but still has palpitations. Went to NP at her PCP office and was taken of he Toprol as it was not helping her BP. Tried on carvedilol but did not wok  - felt like that increased her HR. Also then tried another medication but had insomnia from that and headaches. Now she is on Nadalol and now she is having some fatigue from that and reports at night she feels like that raises her heart as well. States she is here because cardiology feels as though it is reflux and she reports with the UGI series she has not had reflux or hernia notified on there. Palpitations is most notable at   night and to her feels as though it is her heart. HR is usually 70 or below.    She reports she has been taking Prilosec and does not seem to have many issues. Has some occasional belching and bloating. Rarely she will have some mild epigastric discomfort. No N/V, dysphagia. She states in the past the protnix worked well as well for the burnign that she would occasional experience.    Reports she has gained 10 lbs to her midsection and feels as though this was related to being without her Ozempic for 3 months due to it being on backorder. Feels as though she probably is not eating the right  things. Has seen a nutritionist in the past as well. Encouraged to eat more protein. She mostly enjoys dinner.    If she eats ruffage she will have the need to use the bathroom. Limits this as much as possible.    She reports she is due this May for repeat colonoscopy.     Current Outpatient Medications  Medication Sig Dispense Refill   ALPRAZolam (XANAX) 0.5 MG tablet As needed     aspirin EC 81 MG tablet Take by mouth.     calcium-vitamin D (OSCAL WITH D) 500-200 MG-UNIT per tablet Take 0.5 tablets by mouth daily.       Cholecalciferol (D-3-5) 5000 units capsule Take by mouth.     metFORMIN (GLUCOPHAGE) 500 MG tablet Take by mouth. One half bid     nadolol (CORGARD) 40 MG tablet Take 40 mg by mouth daily.     omeprazole (PRILOSEC) 40 MG capsule Take 40 mg by mouth daily.     Semaglutide (OZEMPIC, 1 MG/DOSE, ) Inject into the skin. 1 mg weekly     No current facility-administered medications for this visit.    Past Medical History:  Diagnosis Date   Chronic kidney disease    BLADDER CANCER - FOLLOWED YEARLY   Diabetes mellitus    "PRE DIABETIC" DIET CONTROLLED   Dysrhythmia    UNK ARRYTHMIA    Hypertension    Nausea & vomiting    No pertinent past medical history CONSTIPATION AND EPIGASTRIC PAIN   EPIGASTRIC PAIN   Osteoarthritis of left knee 10/07/2011   Pneumonia    HX PE 1972 SUPERFICIAL PHLEBITIS 2010    Past Surgical History:  Procedure Laterality Date   ABDOMINAL HYSTERECTOMY     1989   ARCUATE KERATECTOMY     BREAST BIOPSY     BIL   CARDIAC CATHETERIZATION     8 YRS AGO/ STRESS ECHO 8 YRS AGO   CARPAL TUNNEL RELEASE     BILATERAL   CHOLECYSTECTOMY     CYSTOSCOPY     YEARLY   HEMORROIDECTOMY     INGUINAL HERNIA REPAIR     JOINT REPLACEMENT     RT TOTAL KNEE 2011   KNEE ARTHROPLASTY  10/05/2011   Procedure: COMPUTER ASSISTED TOTAL KNEE ARTHROPLASTY;  Surgeon: Christopher Y Blackman;  Location: [facility];  Service: Orthopedics;  Laterality: Left;  preop  femoral nerve block   NONE  09/26/11   NO PREVIOUS SURG   NONE     ULNAR NERVE REPAIR     BIL   WRIST ARTHROSCOPY     BIL    No family history on file.  Allergies as of 02/19/2023 - Review Complete 02/19/2023  Allergen Reaction Noted   Erythromycin Shortness Of Breath and Other (See Comments) 09/24/2011   Tape  10/05/2011   Epinephrine    03/04/2017   Losartan Other (See Comments) 10/09/2019   Other  01/28/2017    Social History   Socioeconomic History   Marital status: Single    Spouse name: Not on file   Number of children: Not on file   Years of education: Not on file   Highest education level: Not on file  Occupational History   Not on file  Tobacco Use   Smoking status: Never    Passive exposure: Past   Smokeless tobacco: Never  Substance and Sexual Activity   Alcohol use: No   Drug use: No   Sexual activity: Not on file  Other Topics Concern   Not on file  Social History Narrative   Not on file   Social Determinants of Health   Financial Resource Strain: Not on file  Food Insecurity: Not on file  Transportation Needs: Not on file  Physical Activity: Not on file  Stress: Not on file  Social Connections: Not on file  Intimate Partner Violence: Not on file     Review of Systems   Gen: Denies any fever, chills, fatigue, weight loss, lack of appetite.  CV: Denies chest pain, heart palpitations, peripheral edema, syncope.  Resp: Denies shortness of breath at rest or with exertion. Denies wheezing or cough.  GI: see HPI GU : Denies urinary burning, urinary frequency, urinary hesitancy MS: Denies joint pain, muscle weakness, cramps, or limitation of movement.  Derm: Denies rash, itching, dry skin Psych: Denies depression, anxiety, memory loss, and confusion Heme: Denies bruising, bleeding, and enlarged lymph nodes.   Physical Exam   BP (!) 188/83 (BP Location: Left Arm, Patient Position: Sitting, Cuff Size: Large)   Pulse 78   Temp (!) 97.3 F  (36.3 C) (Oral)   Ht 5' 4" (1.626 m)   Wt 232 lb 3.2 oz (105.3 kg)   BMI 39.86 kg/m   General:   Alert and oriented. Pleasant and cooperative. Well-nourished and well-developed.  Head:  Normocephalic and atraumatic. Eyes:  Without icterus, sclera clear and conjunctiva pink.  Ears:  Normal auditory acuity. Mouth:  No deformity or lesions, oral mucosa pink.  Lungs:  Clear to auscultation bilaterally. No wheezes, rales, or rhonchi. No distress.  Heart:  S1, S2 present without murmurs appreciated.  Abdomen:  +BS, soft, non-tender and non-distended. No HSM noted. No guarding or rebound. No masses appreciated.  Rectal:  Deferred Msk:  Symmetrical without gross deformities. Normal posture. Extremities:  Without edema. Neurologic:  Alert and  oriented x4;  grossly normal neurologically. Skin:  Intact without significant lesions or rashes. Psych:  Alert and cooperative. Normal mood and affect.   Assessment   Stina Jasmin is a 76 y.o. female with a history of HTN, Rehman, diabetes, CKD, bladder cancer, constipation presenting today for evaluation of GERD.  GERD, palpitations: Chronic history of GERD with burning sensation in her throat and stomach with mild epigastric discomfort as well as palpitations.  She states her heart palpitations are her main concern that she has had multiple evaluations by cardiology including stress, echo, cardiac cath, CTA without any abnormal findings other than hyperlipidemia.  She has tried many different beta-blockers as well and has not tolerated a few of these.  Cardiology felt that her symptoms are secondary to reflux.  Currently maintained on omeprazole.  Denies any nausea or vomiting or dysphagia.  Does have some occasional belching and bloating.  Of note she has had multiple upper GI series with most recent being in October 2023 revealing   frequent reported tertiary esophageal contractions which could be contributing to her symptoms.  Will request prior EGD results  and patient would like to proceed with a repeat EGD to rule out worsening hiatal hernia and further evaluate her reflux.  Screening colon cancer: Last colonoscopy reported in May 2019, states she was recommended to have a 5-year repeat.  Will request prior records to have on file. No alarm symptoms present. Has baseline urgency with an excessive fiber/consumption of roughage.  No melena or BRBPR. She reports she was already on recall with the [facility] to have colonoscopy performed in May of this year however she would like to proceed with colonoscopy performed at the same time as her EGD for convenience.  PLAN   Request prior EGD and colonoscopy records from [facility] Proceed with upper endoscopy and colonoscopy with propofol by Dr. Carver in near future: the risks, benefits, and alternatives have been discussed with the patient in detail. The patient states understanding and desires to proceed. ASA 3 Hold Ozempic for 1 week prior Hold metformin the night prior to morning of procedure Omeprazole 40 mg daily.  Follow with PCP and cardiology regarding elevated BP.  Follow-up post procedure as needed.  Clemie General, MSN, FNP-BC, AGACNP-BC [facility] 

## 2023-02-18 NOTE — Progress Notes (Signed)
GI Office Note    Referring Provider: Renee Ramus, MD Primary Care Physician:  Renee Ramus, MD  Primary Gastroenterologist: Elon Alas. Abbey Chatters, DO  Chief Complaint   Chief Complaint  Patient presents with   Gastroesophageal Reflux    Referred for GERD. Reports her cardiologist told her symptoms are from acid reflux. And also told she has hernia and treated with protonix for 4 -5 years. Reports she has had two swallow test in [place] and pt states it did not show reflux or hernia. Currently taking prilosec.     History of Present Illness   Lindsey Williams is a 77 y.o. female presenting today at the request of Renee Ramus, MD for GERD.   Appears patient had a colonoscopy/sigmoidoscopy in 2007 at [facility] but report not available.   Labs 07/24/22: CBC and CMP unremarkable. A1c 5  Per referral notes patient ports cough and epigastric discomfort at times as well as a burning sensation in her throat and her stomach for several years that is worse with spicy and greasy foods.  Also with morning nausea and a lot of belching.  Reportedly was taking Protonix 40 mg twice daily for years which did not seem to help.  Also having heart palpitations at times and felt that was causing her burning pain in her chest.  No reports an EGD and colonoscopy done in May 2019 for similar complaints and small hiatal hernia noted at that time by Dr. Truddie Hidden.  PCP had advised for her to start taking Prilosec 40 mg twice daily for couple weeks and adding Carafate and obtain upper GI series.  Advised to consider EGD pending these results.   UGI series October 2023 with frequent uncoordinated tertiary esophageal contractions.   Today: Reports she has always had palpitation and has been evaluated by cardiology who says everything is normal. Is usually triggered by caffeine so she stopped caffeine and it still occurs. She went to her PCP and old her that it would flutter and then stop and start  and was told it was possible reflux. Se[facility] with [facility]. States she had an EGD and colonoscopy with Dr. Truddie Hidden and was told she had a small hiatal hernia and acid reflux. She reports she was put on Protonix at that time. States nothing needed to be done with hiatal hernia. Palpitations have continued despite the protonix. Because of this she went to see another cardiologist and did the same evaluation of her heart as Dr. Bennett Scrape. Has had an echo, stress test, cardiac caths, and CTA as well. Did not tolerate Lipitor or Crestor. States her BP would go up as well. States cardiology out her on metformin due to mild elevated A1c and she was still having palpitations. Tried Ozempic and has been on that for 2 years but still has palpitations. Went to NP at her PCP office and was taken of he Toprol as it was not helping her BP. Tried on carvedilol but did not wok  - felt like that increased her HR. Also then tried another medication but had insomnia from that and headaches. Now she is on Nadalol and now she is having some fatigue from that and reports at night she feels like that raises her heart as well. States she is here because cardiology feels as though it is reflux and she reports with the UGI series she has not had reflux or hernia notified on there. Palpitations is most notable at  night and to her feels as though it is her heart. HR is usually 70 or below.    She reports she has been taking Prilosec and does not seem to have many issues. Has some occasional belching and bloating. Rarely she will have some mild epigastric discomfort. No N/V, dysphagia. She states in the past the protnix worked well as well for the burnign that she would occasional experience.    Reports she has gained 10 lbs to her midsection and feels as though this was related to being without her Ozempic for 3 months due to it being on backorder. Feels as though she probably is not eating the right  things. Has seen a nutritionist in the past as well. Encouraged to eat more protein. She mostly enjoys dinner.    If she eats ruffage she will have the need to use the bathroom. Limits this as much as possible.    She reports she is due this May for repeat colonoscopy.     Current Outpatient Medications  Medication Sig Dispense Refill   ALPRAZolam (XANAX) 0.5 MG tablet As needed     aspirin EC 81 MG tablet Take by mouth.     calcium-vitamin D (OSCAL WITH D) 500-200 MG-UNIT per tablet Take 0.5 tablets by mouth daily.       Cholecalciferol (D-3-5) 5000 units capsule Take by mouth.     metFORMIN (GLUCOPHAGE) 500 MG tablet Take by mouth. One half bid     nadolol (CORGARD) 40 MG tablet Take 40 mg by mouth daily.     omeprazole (PRILOSEC) 40 MG capsule Take 40 mg by mouth daily.     Semaglutide (OZEMPIC, 1 MG/DOSE, [place]) Inject into the skin. 1 mg weekly     No current facility-administered medications for this visit.    Past Medical History:  Diagnosis Date   Chronic kidney disease    BLADDER CANCER - FOLLOWED YEARLY   Diabetes mellitus    "PRE DIABETIC" DIET CONTROLLED   Dysrhythmia    UNK ARRYTHMIA    Hypertension    Nausea & vomiting    No pertinent past medical history CONSTIPATION AND EPIGASTRIC PAIN   EPIGASTRIC PAIN   Osteoarthritis of left knee 10/07/2011   Pneumonia    HX PE 1972 SUPERFICIAL PHLEBITIS 2010    Past Surgical History:  Procedure Laterality Date   ABDOMINAL HYSTERECTOMY     1989   ARCUATE KERATECTOMY     BREAST BIOPSY     BIL   CARDIAC CATHETERIZATION     8 YRS AGO/ STRESS ECHO 8 YRS AGO   CARPAL TUNNEL RELEASE     BILATERAL   CHOLECYSTECTOMY     CYSTOSCOPY     YEARLY   HEMORROIDECTOMY     INGUINAL HERNIA REPAIR     JOINT REPLACEMENT     RT TOTAL KNEE 2011   KNEE ARTHROPLASTY  10/05/2011   Procedure: COMPUTER ASSISTED TOTAL KNEE ARTHROPLASTY;  Surgeon: Mcarthur Rossetti;  Location: [facility];  Service: Orthopedics;  Laterality: Left;  preop  femoral nerve block   NONE  09/26/11   NO PREVIOUS SURG   NONE     ULNAR NERVE REPAIR     BIL   WRIST ARTHROSCOPY     BIL    No family history on file.  Allergies as of 02/19/2023 - Review Complete 02/19/2023  Allergen Reaction Noted   Erythromycin Shortness Of Breath and Other (See Comments) 09/24/2011   Tape  10/05/2011   Epinephrine  03/04/2017   Losartan Other (See Comments) 10/09/2019   Other  01/28/2017    Social History   Socioeconomic History   Marital status: Single    Spouse name: Not on file   Number of children: Not on file   Years of education: Not on file   Highest education level: Not on file  Occupational History   Not on file  Tobacco Use   Smoking status: Never    Passive exposure: Past   Smokeless tobacco: Never  Substance and Sexual Activity   Alcohol use: No   Drug use: No   Sexual activity: Not on file  Other Topics Concern   Not on file  Social History Narrative   Not on file   Social Determinants of Health   Financial Resource Strain: Not on file  Food Insecurity: Not on file  Transportation Needs: Not on file  Physical Activity: Not on file  Stress: Not on file  Social Connections: Not on file  Intimate Partner Violence: Not on file     Review of Systems   Gen: Denies any fever, chills, fatigue, weight loss, lack of appetite.  CV: Denies chest pain, heart palpitations, peripheral edema, syncope.  Resp: Denies shortness of breath at rest or with exertion. Denies wheezing or cough.  GI: see HPI GU : Denies urinary burning, urinary frequency, urinary hesitancy MS: Denies joint pain, muscle weakness, cramps, or limitation of movement.  Derm: Denies rash, itching, dry skin Psych: Denies depression, anxiety, memory loss, and confusion Heme: Denies bruising, bleeding, and enlarged lymph nodes.   Physical Exam   BP (!) 188/83 (BP Location: Left Arm, Patient Position: Sitting, Cuff Size: Large)   Pulse 78   Temp (!) 97.3 F  (36.3 C) (Oral)   Ht 5\' 4"  (1.626 m)   Wt 232 lb 3.2 oz (105.3 kg)   BMI 39.86 kg/m   General:   Alert and oriented. Pleasant and cooperative. Well-nourished and well-developed.  Head:  Normocephalic and atraumatic. Eyes:  Without icterus, sclera clear and conjunctiva pink.  Ears:  Normal auditory acuity. Mouth:  No deformity or lesions, oral mucosa pink.  Lungs:  Clear to auscultation bilaterally. No wheezes, rales, or rhonchi. No distress.  Heart:  S1, S2 present without murmurs appreciated.  Abdomen:  +BS, soft, non-tender and non-distended. No HSM noted. No guarding or rebound. No masses appreciated.  Rectal:  Deferred Msk:  Symmetrical without gross deformities. Normal posture. Extremities:  Without edema. Neurologic:  Alert and  oriented x4;  grossly normal neurologically. Skin:  Intact without significant lesions or rashes. Psych:  Alert and cooperative. Normal mood and affect.   Assessment   Lindsey Williams is a 77 y.o. female with a history of HTN, Rehman, diabetes, CKD, bladder cancer, constipation presenting today for evaluation of GERD.  GERD, palpitations: Chronic history of GERD with burning sensation in her throat and stomach with mild epigastric discomfort as well as palpitations.  She states her heart palpitations are her main concern that she has had multiple evaluations by cardiology including stress, echo, cardiac cath, CTA without any abnormal findings other than hyperlipidemia.  She has tried many different beta-blockers as well and has not tolerated a few of these.  Cardiology felt that her symptoms are secondary to reflux.  Currently maintained on omeprazole.  Denies any nausea or vomiting or dysphagia.  Does have some occasional belching and bloating.  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  Will request prior EGD results  and patient would like to proceed with a repeat EGD to rule out worsening hiatal hernia and further evaluate her reflux.  Screening colon cancer: Last colonoscopy reported in May 2019, states she was recommended to have a 5-year repeat.  Will request prior records to have on file. No alarm symptoms present. Has baseline urgency with an excessive fiber/consumption of roughage.  No melena or BRBPR. She reports she was already on recall with the [facility] clinic to have colonoscopy performed in May of this year however she would like to proceed with colonoscopy performed at the same time as her EGD for convenience.  PLAN   Request prior EGD and colonoscopy records from [place] with upper endoscopy and colonoscopy with propofol by Dr. Abbey Chatters in near future: the risks, benefits, and alternatives have been discussed with the patient in detail. The patient states understanding and desires to proceed. ASA 3 Hold Ozempic for 1 week prior Hold metformin the night prior to morning of procedure Omeprazole 40 mg daily.  Follow with PCP and cardiology regarding elevated BP.  Follow-up post procedure as needed.  Venetia Night, MSN, FNP-BC, AGACNP-BC [facility]

## 2023-02-18 NOTE — Progress Notes (Signed)
The patient is someone that I saw her last year and sent her for physical therapy for her shoulders and her neck.  She is 77 years old.  She said that therapy really did help quite a bit with her neck and shoulders but she still has right shoulder pain that wakes her up at night.  She also has right arm pain.  She reports that she had a series of surgeries on her right elbow and her right hand about 30 years ago.  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  She states she was even in a cast on her right arm and has never been able to get her elbow fully straight.  She reports weakness throughout the right upper extremity.  Previous x-rays of her right shoulder last year were normal.  I can easily put her right shoulder through internal and external rotation as well as abduction and flexion.  She still has pain in the subacromial outlet and subdeltoid area of the shoulder.  There is a very long well-healed surgical incision over the medial aspect of the elbow and in the carpal tunnel area.  She lacks full extension by about 5 degrees of the right elbow.  She has weak pinch and grip strength on that side and says her fingers feel "cold" all the time.  This point I would like to send her for an MRI of her right shoulder to assess the rotator cuff and I would like to send her to Dr. Ernestina Patches for right upper extremity EMG/NCV studies to assess for continued nerve compression or to get an idea of what is going on from a nerve standpoint and her right upper extremity.  We will see her in follow-up once we have the studies.  She agrees to this treatment plan.  All questions and concerns were addressed and answered.

## 2023-02-19 ENCOUNTER — Encounter: Payer: Self-pay | Admitting: Gastroenterology

## 2023-02-19 ENCOUNTER — Ambulatory Visit (INDEPENDENT_AMBULATORY_CARE_PROVIDER_SITE_OTHER): Payer: Medicare Other | Admitting: Gastroenterology

## 2023-02-19 VITALS — BP 188/83 | HR 78 | Temp 97.3°F | Ht 64.0 in | Wt 232.2 lb

## 2023-02-19 DIAGNOSIS — K449 Diaphragmatic hernia without obstruction or gangrene: Secondary | ICD-10-CM | POA: Diagnosis not present

## 2023-02-19 DIAGNOSIS — Z1211 Encounter for screening for malignant neoplasm of colon: Secondary | ICD-10-CM | POA: Diagnosis not present

## 2023-02-19 DIAGNOSIS — K219 Gastro-esophageal reflux disease without esophagitis: Secondary | ICD-10-CM

## 2023-02-19 MED ORDER — PEG 3350-KCL-NA BICARB-NACL 420 G PO SOLR: 4000.0000 mL | Freq: Once | ORAL | 0 refills | Status: AC

## 2023-02-19 MED ORDER — OMEPRAZOLE 40 MG PO CPDR: 40.0000 mg | DELAYED_RELEASE_CAPSULE | Freq: Every day | ORAL | 3 refills | Status: DC

## 2023-02-19 NOTE — Progress Notes (Deleted)
Reports she has always had palpitation and has been evaluated by cardiology who says everything is normal. Is usually triggered by caffeine so she stopped caffeine and it still occurs. She went to her PCP and old her that it would flutter and then stop and start and was told it was possible reflux. Seen GI with [facility]. States she had an EGD and colonoscopy with Dr. Truddie Hidden and was told she had a small hiatal hernia and acid reflux. She reports she was put on Protonix at that time. States nothing needed to be done with hiatal hernia. Palpitations have continued despite the protonix. Because of this she went to see another cardiologist and did the same evaluation of her heart as Dr. Bennett Scrape. Has had an echo, stress test, cardiac caths, and CTA as well. Did not tolerate Lipitor or Crestor. States her BP would go up as well. States cardiology out her on metformin due to mild elevated A1c and she was still having palpitations. Tried Ozempic and has been on that for 2 years but still has palpitations. Went to NP at her PCP office and was taken of he Toprol as it was not helping her BP. Tried on carvedilol but did not wok  - felt like that increased her HR. Also then tried another medication but had insomnia from that and headaches. Now she is on Nadalol and now she is having some fatigue from that and reports at night she feels like that raises her heart as well. States she is here because cardiology feels as though it is reflux and she reports with the UGI series she has not had reflux or hernia notified on there. Palpitations is most notable at night and to her feels as though it is her heart. HR is usually 70 or below.   She reports she has been taking Prilosec and does not seem to have many issues. Has some occasional belching and bloating. Rarely she will have some mild epigastric discomfort. No N/V, dysphagia. She states in the past the protnix worked well as well for the  burnign that she would occasional experience.   Reports she has gained 10 lbs to her midsection and feels as though this was related to being without her Ozempic for 3 months due to it being on backorder. Feels as though she probably is not eating the right things. Has seen a nutritionist in the past as well. Encouraged to eat more protein. She mostly enjoys dinner.   If she eats ruffage she will have the need to use the bathroom. Limits this as much as possible.   She reports she is due this May for repeat colonoscopy.

## 2023-02-19 NOTE — Patient Instructions (Addendum)
We are scheduling you for a colonoscopy and EGD in the near future with Dr. Abbey Chatters. You will receive separate detailed written instructions regarding your prep.  You will need to hold your Ozempic for 1 week prior as well as your metformin the night prior to and the morning of your procedure.  Continue taking your omeprazole 40 mg once daily.  I have sent this prescription to the pharmacy for you.  We discussed your heart rate and BP with your primary care this week.  It was a pleasure to see you today!  It was a pleasure to see you today. I want to create trusting relationships with patients. If you receive a survey regarding your visit,  I greatly appreciate you taking time to fill this out on paper or through your MyChart. I value your feedback.  Venetia Night, MSN, FNP-BC, AGACNP-BC [facility]

## 2023-02-20 ENCOUNTER — Ambulatory Visit: Payer: PRIVATE HEALTH INSURANCE | Admitting: Gastroenterology

## 2023-02-22 ENCOUNTER — Telehealth: Payer: Self-pay | Admitting: Gastroenterology

## 2023-02-22 NOTE — Telephone Encounter (Signed)
Please ensure patients EGD and colonoscopy instructions include holding Ozempic for at least 1 week prior and metformin the night prior to and morning of procedure.   Venetia Night, MSN, APRN, FNP-BC, AGACNP-BC [facility]

## 2023-02-22 NOTE — Telephone Encounter (Signed)
Error

## 2023-02-25 NOTE — Telephone Encounter (Signed)
Pt was instructed to hold the Ozempic starting 03/14/23 and to hold Metformin night prior and morning of procedure. Pt was also informed of pre-op date on 03/18/24 at 1:15 pm at [facility]. Verbalized understanding.

## 2023-03-06 ENCOUNTER — Ambulatory Visit (INDEPENDENT_AMBULATORY_CARE_PROVIDER_SITE_OTHER): Payer: Medicare Other | Admitting: Physical Medicine and Rehabilitation

## 2023-03-06 ENCOUNTER — Ambulatory Visit
Admission: RE | Admit: 2023-03-06 | Discharge: 2023-03-06 | Disposition: A | Discharge status: Home / Self Care | Payer: Medicare Other | Source: Ambulatory Visit | Attending: Orthopaedic Surgery | Admitting: Orthopaedic Surgery

## 2023-03-06 ENCOUNTER — Telehealth: Payer: Self-pay | Admitting: Orthopaedic Surgery

## 2023-03-06 ENCOUNTER — Telehealth (INDEPENDENT_AMBULATORY_CARE_PROVIDER_SITE_OTHER): Payer: Self-pay | Admitting: Internal Medicine

## 2023-03-06 DIAGNOSIS — M542 Cervicalgia: Secondary | ICD-10-CM

## 2023-03-06 DIAGNOSIS — M79641 Pain in right hand: Secondary | ICD-10-CM

## 2023-03-06 DIAGNOSIS — M79601 Pain in right arm: Secondary | ICD-10-CM | POA: Diagnosis not present

## 2023-03-06 DIAGNOSIS — R202 Paresthesia of skin: Secondary | ICD-10-CM

## 2023-03-06 DIAGNOSIS — R209 Unspecified disturbances of skin sensation: Secondary | ICD-10-CM

## 2023-03-06 DIAGNOSIS — G8929 Other chronic pain: Secondary | ICD-10-CM

## 2023-03-06 NOTE — Telephone Encounter (Signed)
Detailed message left on voicemail (OK per DPR) with pre op appt information. Pre op scheduled for 03/19/23 at 1:15pm in person [facility]

## 2023-03-06 NOTE — Telephone Encounter (Signed)
Patient states she need something other than Tramadol, or Gabapentin. She wants something stronger for pain. Please advise.Lindsey Williams- on [address] in [place]. Please call patient

## 2023-03-06 NOTE — Telephone Encounter (Signed)
Lvm advising pt of this and to schedule an appointment

## 2023-03-06 NOTE — Progress Notes (Signed)
Functional Pain Scale - descriptive words and definitions  Distressing (6)    Pain is present/unable to complete most ADLs limited by pain/sleep is difficult and active distraction is only marginal. Moderate range order  Average Pain 7-8  Right handed. Pain in hand that radiates to the neck. Numbness in all fingers except thumb and they stay cold. Pain worse at night

## 2023-03-11 ENCOUNTER — Ambulatory Visit (INDEPENDENT_AMBULATORY_CARE_PROVIDER_SITE_OTHER): Payer: Medicare Other | Admitting: Orthopaedic Surgery

## 2023-03-11 ENCOUNTER — Encounter: Payer: Self-pay | Admitting: Orthopaedic Surgery

## 2023-03-11 DIAGNOSIS — M25511 Pain in right shoulder: Secondary | ICD-10-CM

## 2023-03-11 DIAGNOSIS — M75121 Complete rotator cuff tear or rupture of right shoulder, not specified as traumatic: Secondary | ICD-10-CM | POA: Diagnosis not present

## 2023-03-11 DIAGNOSIS — G8929 Other chronic pain: Secondary | ICD-10-CM

## 2023-03-11 MED ORDER — ACETAMINOPHEN-CODEINE 300-30 MG PO TABS: 1.0000 | ORAL_TABLET | Freq: Three times a day (TID) | ORAL | 0 refills | Status: DC | PRN

## 2023-03-11 NOTE — Progress Notes (Signed)
The patient comes in today to go over MRI of her right shoulder as well as nerve conduction studies of her right upper extremity.  She reports that she has had at least 6 different surgeries on her right upper extremity including carpal tunnel release and cubital tunnel release.  The nerve conduction studies showed mild to moderate chronic carpal tunnel syndrome.  There is no evidence of cubital tunnel syndrome or radicular issues coming from the cervical spine.  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  She has dealt with chronic shoulder pain for some time now.  She has had all types of interventions for that shoulder.  Exam I can easily move the shoulder around and she is a very tough 77 year old female but there is weakness of the cuff.  The MRI does show some muscle atrophy as well showing the chronic nature of this tear.  She does have weakness with abduction and external rotation of the shoulder.  There is no blocks to rotation fortunately.  It does her past 90 degrees of abduction and forward flexion as well with her right shoulder.  Her right hand still has pretty good grip and pinch strength and no muscle atrophy.  From the standpoint of the nerve conduction studies, I would not recommend any other thing for her right upper extremity.  I believe what she is dealing with is stable from all of her previous surgeries with the right upper extremity in terms of nerve releases.  I would like to send her to my partner Dr. August Saucer to evaluate her right shoulder in terms of potential surgical interventions if needed or warranted given her full-thickness retracted tear with muscle atrophy of the rotator cuff.  She agrees with this treatment plan.  I will send in some Tylenol 3.

## 2023-03-11 NOTE — Procedures (Unsigned)
EMG & NCV Findings: Evaluation of the right median motor nerve showed reduced amplitude (4.2 mV).  The right median (across palm) sensory nerve showed prolonged distal peak latency (Wrist, 4.1 ms) and prolonged distal peak latency (Palm, 2.6 ms).  The right ulnar sensory nerve showed prolonged distal peak latency (4.3 ms) and decreased conduction velocity (Wrist-5th Digit, 33 m/s).  All remaining nerves (as indicated in the following tables) were within normal limits.    All examined muscles (as indicated in the following table) showed no evidence of electrical instability.    Impression: The above electrodiagnostic study is ABNORMAL and reveals evidence of a mild to moderate right median nerve entrapment at the wrist (carpal tunnel syndrome) affecting sensory and motor components.   There is no significant electrodiagnostic evidence of any other focal nerve entrapment, brachial plexopathy or cervical radiculopathy.  The prolonged distal peak latency of the ulnar nerve is likely temperature artifact.  Recommendations: 1.  Follow-up with referring physician. 2.  Continue current management of symptoms. 3.  Continue use of resting splint at night-time and as needed during the day.  ___________________________ Elease Hashimoto Board Certified, American Board of Physical Medicine and Rehabilitation    Nerve Conduction Studies Anti Sensory Summary Table   Stim Site NR Peak (ms) Norm Peak (ms) P-T Amp (V) Norm P-T Amp Site1 Site2 Delta-P (ms) Dist (cm) Vel (m/s) Norm Vel (m/s)  Right Median Acr Palm Anti Sensory (2nd Digit)  31.4C  Wrist    *4.1 <3.6 31.9 >10 Wrist Palm 1.5 0.0    Palm    *2.6 <2.0 43.9         Right Radial Anti Sensory (Base 1st Digit)  30.7C  Wrist    2.3 <3.1 30.2  Wrist Base 1st Digit 2.3 0.0    Right Ulnar Anti Sensory (5th Digit)  31.4C  Wrist    *4.3 <3.7 23.7 >15.0 Wrist 5th Digit 4.3 14.0 *33 >38   Motor Summary Table   Stim Site NR Onset (ms) Norm Onset  (ms) O-P Amp (mV) Norm O-P Amp Site1 Site2 Delta-0 (ms) Dist (cm) Vel (m/s) Norm Vel (m/s)  Right Median Motor (Abd Poll Brev)  30.6C  Wrist    3.8 <4.2 *4.2 >5 Elbow Wrist 4.0 20.0 50 >50  Elbow    7.8  3.0         Right Ulnar Motor (Abd Dig Min)  30.7C  Wrist    3.1 <4.2 9.1 >3 B Elbow Wrist 3.4 20.0 59 >53  B Elbow    6.5  9.6  A Elbow B Elbow 1.2 10.0 83 >53  A Elbow    7.7  7.0          EMG   Side Muscle Nerve Root Ins Act Fibs Psw Amp Dur Poly Recrt Int Dennie Bible Comment  Right Abd Poll Brev Median C8-T1 Nml Nml Nml Nml Nml 0 Nml Nml   Right 1stDorInt Ulnar C8-T1 Nml Nml Nml Nml Nml 0 Nml Nml   Right PronatorTeres Median C6-7 Nml Nml Nml Nml Nml 0 Nml Nml   Right Biceps Musculocut C5-6 Nml Nml Nml Nml Nml 0 Nml Nml   Right Deltoid Axillary C5-6 Nml Nml Nml Nml Nml 0 Nml Nml     Nerve Conduction Studies Anti Sensory Left/Right Comparison   Stim Site L Lat (ms) R Lat (ms) L-R Lat (ms) L Amp (V) R Amp (V) L-R Amp (%) Site1 Site2 L Vel (m/s) R Vel (m/s) L-R Vel (m/s)  Median Acr Palm Anti Sensory (2nd Digit)  31.4C  Wrist  *4.1   31.9  Wrist Palm     Palm  *2.6   43.9        Radial Anti Sensory (Base 1st Digit)  30.7C  Wrist  2.3   30.2  Wrist Base 1st Digit     Ulnar Anti Sensory (5th Digit)  31.4C  Wrist  *4.3   23.7  Wrist 5th Digit  *33    Motor Left/Right Comparison   Stim Site L Lat (ms) R Lat (ms) L-R Lat (ms) L Amp (mV) R Amp (mV) L-R Amp (%) Site1 Site2 L Vel (m/s) R Vel (m/s) L-R Vel (m/s)  Median Motor (Abd Poll Brev)  30.6C  Wrist  3.8   *4.2  Elbow Wrist  50   Elbow  7.8   3.0        Ulnar Motor (Abd Dig Min)  30.7C  Wrist  3.1   9.1  B Elbow Wrist  59   B Elbow  6.5   9.6  A Elbow B Elbow  83   A Elbow  7.7   7.0           Waveforms:

## 2023-03-12 ENCOUNTER — Encounter: Payer: Self-pay | Admitting: Physical Medicine and Rehabilitation

## 2023-03-12 NOTE — Progress Notes (Signed)
Lindsey Williams - 77 y.o. female MRN [id number]  Date of birth: Aug 03, 1946  Office Visit Note: Visit Date: 03/06/2023 PCP: Ave Filter, MD Referred by: Kathryne Hitch*  Subjective: Chief Complaint  Patient presents with   Right Hand - Numbness   Right Arm - Numbness, Pain   HPI:  Lindsey Williams is a 77 y.o. female who comes in today at the request of Dr. Doneen Poisson for evaluation and management of chronic, worsening and severe pain, numbness and tingling in the Right upper extremities.  Patient is Right hand dominant.  She is a somewhat complicated patient with complaints of pain numbness and tingling in all 4 digits except the thumb on the right hand with symptoms in the arm shoulder and neck.  Symptoms seem to refer up the arm to a degree more than down the arm.  She is diabetic but has consistently low A1c numbers.  She is set to have a shoulder MRI done in the next day.  She has had 6 surgeries or more of the upper extremity including carpal tunnel release and cubital tunnel release.  She denies any focal weakness.  She reports that her hand gets cold at times.  Did not find any recent electrodiagnostic studies.   I spent more than 30 minutes speaking face-to-face with the patient with 50% of the time in counseling and discussing coordination of care.   Review of Systems  Musculoskeletal:  Positive for joint pain and neck pain.  Neurological:  Positive for tingling.  All other systems reviewed and are negative.  Otherwise per HPI.  Assessment & Plan: Visit Diagnoses:    ICD-10-CM   1. Paresthesia of skin  R20.2 NCV with EMG (electromyography)    2. Right arm pain  M79.601     3. Pain in right hand  M79.641     4. Cervicalgia  M54.2     5. Cold hands  R20.9       Plan: Impression: Clinically complicated patient with pain numbness and tingling in the hand but nondermatomal and on peripheral nerve distribution symptoms.  Has had multiple surgeries of the  upper extremity and now with significant shoulder pain with expected MRI of the shoulder coming soon.  No recent cervical imaging.  Electrodiagnostic study performed.  The above electrodiagnostic study is ABNORMAL and reveals evidence of a mild to moderate right median nerve entrapment at the wrist (carpal tunnel syndrome) affecting sensory and motor components.   There is no significant electrodiagnostic evidence of any other focal nerve entrapment, brachial plexopathy or cervical radiculopathy.  The prolonged distal peak latency of the ulnar nerve is likely temperature artifact.  Recommendations: 1.  Follow-up with referring physician. 2.  Continue current management of symptoms. 3.  Continue use of resting splint at night-time and as needed during the day.  Meds & Orders: No orders of the defined types were placed in this encounter.   Orders Placed This Encounter  Procedures   NCV with EMG (electromyography)    Follow-up: Return for Doneen Poisson, MD.   Procedures: No procedures performed  EMG & NCV Findings: Evaluation of the right median motor nerve showed reduced amplitude (4.2 mV).  The right median (across palm) sensory nerve showed prolonged distal peak latency (Wrist, 4.1 ms) and prolonged distal peak latency (Palm, 2.6 ms).  The right ulnar sensory nerve showed prolonged distal peak latency (4.3 ms) and decreased conduction velocity (Wrist-5th Digit, 33 m/s).  All remaining nerves (as indicated in the following  tables) were within normal limits.    All examined muscles (as indicated in the following table) showed no evidence of electrical instability.    Impression: The above electrodiagnostic study is ABNORMAL and reveals evidence of a mild to moderate right median nerve entrapment at the wrist (carpal tunnel syndrome) affecting sensory and motor components.   There is no significant electrodiagnostic evidence of any other focal nerve entrapment, brachial plexopathy or  cervical radiculopathy.  The prolonged distal peak latency of the ulnar nerve is likely temperature artifact.  Recommendations: 1.  Follow-up with referring physician. 2.  Continue current management of symptoms. 3.  Continue use of resting splint at night-time and as needed during the day.  ___________________________ Elease Hashimoto Board Certified, American Board of Physical Medicine and Rehabilitation    Nerve Conduction Studies Anti Sensory Summary Table   Stim Site NR Peak (ms) Norm Peak (ms) P-T Amp (V) Norm P-T Amp Site1 Site2 Delta-P (ms) Dist (cm) Vel (m/s) Norm Vel (m/s)  Right Median Acr Palm Anti Sensory (2nd Digit)  31.4C  Wrist    *4.1 <3.6 31.9 >10 Wrist Palm 1.5 0.0    Palm    *2.6 <2.0 43.9         Right Radial Anti Sensory (Base 1st Digit)  30.7C  Wrist    2.3 <3.1 30.2  Wrist Base 1st Digit 2.3 0.0    Right Ulnar Anti Sensory (5th Digit)  31.4C  Wrist    *4.3 <3.7 23.7 >15.0 Wrist 5th Digit 4.3 14.0 *33 >38   Motor Summary Table   Stim Site NR Onset (ms) Norm Onset (ms) O-P Amp (mV) Norm O-P Amp Site1 Site2 Delta-0 (ms) Dist (cm) Vel (m/s) Norm Vel (m/s)  Right Median Motor (Abd Poll Brev)  30.6C  Wrist    3.8 <4.2 *4.2 >5 Elbow Wrist 4.0 20.0 50 >50  Elbow    7.8  3.0         Right Ulnar Motor (Abd Dig Min)  30.7C  Wrist    3.1 <4.2 9.1 >3 B Elbow Wrist 3.4 20.0 59 >53  B Elbow    6.5  9.6  A Elbow B Elbow 1.2 10.0 83 >53  A Elbow    7.7  7.0          EMG   Side Muscle Nerve Root Ins Act Fibs Psw Amp Dur Poly Recrt Int Dennie Bible Comment  Right Abd Poll Brev Median C8-T1 Nml Nml Nml Nml Nml 0 Nml Nml   Right 1stDorInt Ulnar C8-T1 Nml Nml Nml Nml Nml 0 Nml Nml   Right PronatorTeres Median C6-7 Nml Nml Nml Nml Nml 0 Nml Nml   Right Biceps Musculocut C5-6 Nml Nml Nml Nml Nml 0 Nml Nml   Right Deltoid Axillary C5-6 Nml Nml Nml Nml Nml 0 Nml Nml     Nerve Conduction Studies Anti Sensory Left/Right Comparison   Stim Site L Lat (ms) R Lat (ms) L-R Lat  (ms) L Amp (V) R Amp (V) L-R Amp (%) Site1 Site2 L Vel (m/s) R Vel (m/s) L-R Vel (m/s)  Median Acr Palm Anti Sensory (2nd Digit)  31.4C  Wrist  *4.1   31.9  Wrist Palm     Palm  *2.6   43.9        Radial Anti Sensory (Base 1st Digit)  30.7C  Wrist  2.3   30.2  Wrist Base 1st Digit     Ulnar Anti Sensory (5th Digit)  31.4C  Wrist  *  4.3   23.7  Wrist 5th Digit  *33    Motor Left/Right Comparison   Stim Site L Lat (ms) R Lat (ms) L-R Lat (ms) L Amp (mV) R Amp (mV) L-R Amp (%) Site1 Site2 L Vel (m/s) R Vel (m/s) L-R Vel (m/s)  Median Motor (Abd Poll Brev)  30.6C  Wrist  3.8   *4.2  Elbow Wrist  50   Elbow  7.8   3.0        Ulnar Motor (Abd Dig Min)  30.7C  Wrist  3.1   9.1  B Elbow Wrist  59   B Elbow  6.5   9.6  A Elbow B Elbow  83   A Elbow  7.7   7.0           Waveforms:             Clinical History: No specialty comments available.     Objective:  VS:  HT:    WT:   BMI:     BP:   HR: bpm  TEMP: ( )  RESP:  Physical Exam Vitals and nursing note reviewed.  Constitutional:      General: She is not in acute distress.    Appearance: Normal appearance. She is well-developed. She is not ill-appearing.  HENT:     Head: Normocephalic and atraumatic.  Eyes:     Conjunctiva/sclera: Conjunctivae normal.     Pupils: Pupils are equal, round, and reactive to light.  Cardiovascular:     Rate and Rhythm: Normal rate.     Pulses: Normal pulses.  Pulmonary:     Effort: Pulmonary effort is normal.  Musculoskeletal:        General: No swelling, tenderness or deformity.     Right lower leg: No edema.     Left lower leg: No edema.     Comments: Inspection reveals well-healed surgical scars about the right wrist and elbow.  No atrophy of the bilateral APB or FDI or hand intrinsics. There is no swelling, color changes, allodynia or dystrophic changes. There is 5 out of 5 strength in the bilateral wrist extension, finger abduction and long finger flexion. There is intact  sensation to light touch in all dermatomal and peripheral nerve distributions. There is a negative Froment's test bilaterally. There is a negative Tinel's test at the bilateral wrist and elbow. There is a negative Phalen's test bilaterally. There is a negative Hoffmann's test bilaterally.  Skin:    General: Skin is warm and dry.     Findings: No erythema or rash.  Neurological:     General: No focal deficit present.     Mental Status: She is alert and oriented to person, place, and time.     Cranial Nerves: No cranial nerve deficit.     Sensory: No sensory deficit.     Motor: No weakness or abnormal muscle tone.     Coordination: Coordination normal.     Gait: Gait normal.  Psychiatric:        Mood and Affect: Mood normal.        Behavior: Behavior normal.      Imaging: No results found.

## 2023-03-18 NOTE — Patient Instructions (Signed)
Lindsey Williams  03/18/2023     @   Your procedure is scheduled on 03/21/2023.  Report to [facility] at 11:15 A.M.  Call this number if you have problems the morning of surgery:  [phone]  If you experience any cold or flu symptoms such as cough, fever, chills, shortness of breath, etc. between now and your scheduled surgery, please notify us at the above number.   Remember:    Please follow the diet and prep instructions given to you by Dr Queen [facility]oss[facility] office.    Take these medicines the morning of surgery with A SIP OF WATER : Xanax Omeprazole and Nadolol    Do not wear jewelry, make-up or nail polish.  Do not wear lotions, powders, or perfumes, or deodorant.  Do not shave 48 hours prior to surgery.  Men may shave face and neck.  Do not bring valuables to the hospital.  [facility] is not responsible for any belongings or valuables.  Contacts, dentures or bridgework may not be worn into surgery.  Leave your suitcase in the car.  After surgery it may be brought to your room.  For patients admitted to the hospital, discharge time will be determined by your treatment team.  Patients discharged the day of surgery will not be allowed to drive home.   Name and phone number of your driver:   Family Special instructions:  N/A  Please read over the following fact sheets that you were given. Care and Recovery After Surgery   Upper Endoscopy, Adult Upper endoscopy is a procedure to look inside the upper GI (gastrointestinal) tract. The upper GI tract is made up of: The esophagus. This is the part of the body that moves food from your mouth to your stomach. The stomach. The duodenum. This is the first part of your small intestine. This procedure is also called esophagogastroduodenoscopy (EGD) or gastroscopy. In this procedure, your health care provider passes a thin, flexible tube (endoscope) through your mouth and down your esophagus into your stomach and into your  duodenum. A small camera is attached to the end of the tube. Images from the camera appear on a monitor in the exam room. During this procedure, your health care provider may also remove a small piece of tissue to be sent to a lab and examined under a microscope (biopsy). Your health care provider may do an upper endoscopy to diagnose cancers of the upper GI tract. You may also have this procedure to find the cause of other conditions, such as: Stomach pain. Heartburn. Pain or problems when swallowing. Nausea and vomiting. Stomach bleeding. Stomach ulcers. Tell a health care provider about: Any allergies you have. All medicines you are taking, including vitamins, herbs, eye drops, creams, and over-the-counter medicines. Any problems you or family members have had with anesthetic medicines. Any bleeding problems you have. Any surgeries you have had. Any medical conditions you have. Whether you are pregnant or may be pregnant. What are the risks? Your healthcare provider will talk with you about risks. These may include: Infection. Bleeding. Allergic reactions to medicines. A tear or hole (perforation) in the esophagus, stomach, or duodenum. What happens before the procedure? When to stop eating and drinking Follow instructions from your health care provider about what you may eat and drink. These may include: 8 hours before your procedure Stop eating most foods. Do not eat meat, fried foods, or fatty foods. Eat only light foods, such as toast or crackers. All liquids are okay  except energy drinks and alcohol. 6 hours before your procedure Stop eating. Drink only clear liquids, such as water, clear fruit juice, black coffee, plain tea, and sports drinks. Do not drink energy drinks or alcohol. 2 hours before your procedure Stop drinking all liquids. You may be allowed to take medicines with small sips of water. If you do not follow your health care provider's instructions, your  procedure may be delayed or canceled. Medicines Ask your health care provider about: Changing or stopping your regular medicines. This is especially important if you are taking diabetes medicines or blood thinners. Taking medicines such as aspirin and ibuprofen. These medicines can thin your blood. Do not take these medicines unless your health care provider tells you to take them. Taking over-the-counter medicines, vitamins, herbs, and supplements. General instructions If you will be going home right after the procedure, plan to have a responsible adult: Take you home from the hospital or clinic. You will not be allowed to drive. Care for you for the time you are told. What happens during the procedure?  An IV will be inserted into one of your veins. You may be given one or more of the following: A medicine to help you relax (sedative). A medicine to numb the throat (local anesthetic). You will lie on your left side on an exam table. Your health care provider will pass the endoscope through your mouth and down your esophagus. Your health care provider will use the scope to check the inside of your esophagus, stomach, and duodenum. Biopsies may be taken. The endoscope will be removed. The procedure may vary among health care providers and hospitals. What happens after the procedure? Your blood pressure, heart rate, breathing rate, and blood oxygen level will be monitored until you leave the hospital or clinic. When your throat is no longer numb, you may be given some fluids to drink. If you were given a sedative during the procedure, it can affect you for several hours. Do not drive or operate machinery until your health care provider says that it is safe. It is up to you to get the results of your procedure. Ask your health care provider, or the department that is doing the procedure, when your results will be ready. Contact a health care provider if you: Have a sore throat that lasts  longer than 1 day. Have a fever. Get help right away if you: Vomit blood or your vomit looks like coffee grounds. Have bloody, black, or tarry stools. Have a very bad sore throat or you cannot swallow. Have difficulty breathing or very bad pain in your chest or abdomen. These symptoms may be an emergency. Get help right away. Call 911. Do not wait to see if the symptoms will go away. Do not drive yourself to the hospital. Summary Upper endoscopy is a procedure to look inside the upper GI tract. During the procedure, an IV will be inserted into one of your veins. You may be given a medicine to help you relax. The endoscope will be passed through your mouth and down your esophagus. Follow instructions from your health care provider about what you can eat and drink. This information is not intended to replace advice given to you by your health care provider. Make sure you discuss any questions you have with your health care provider. Document Revised: 02/21/2022 Document Reviewed: 02/21/2022 Elsevier Patient Education  2023 [employer]  Colonoscopy, Adult A colonoscopy is a procedure to look at the entire large intestine. This  procedure is done using a long, thin, flexible tube that has a camera on the end. You may have a colonoscopy: As a part of normal colorectal screening. If you have certain symptoms, such as: A low number of red blood cells in your blood (anemia). Diarrhea that does not go away. Pain in your abdomen. Blood in your stool. A colonoscopy can help screen for and diagnose medical problems, including: An abnormal growth of cells or tissue (tumor). Abnormal growths within the lining of your intestine (polyps). Inflammation. Areas of bleeding. Tell your health care provider about: Any allergies you have. All medicines you are taking, including vitamins, herbs, eye drops, creams, and over-the-counter medicines. Any problems you or family members have had with  anesthetic medicines. Any bleeding problems you have. Any surgeries you have had. Any medical conditions you have. Any problems you have had with having bowel movements. Whether you are pregnant or may be pregnant. What are the risks? Generally, this is a safe procedure. However, problems may occur, including: Bleeding. Damage to your intestine. Allergic reactions to medicines given during the procedure. Infection. This is rare. What happens before the procedure? Eating and drinking restrictions Follow instructions from your health care provider about eating or drinking restrictions, which may include: A few days before the procedure: Follow a low-fiber diet. Avoid nuts, seeds, dried fruit, raw fruits, and vegetables. 1-3 days before the procedure: Eat only gelatin dessert or ice pops. Drink only clear liquids, such as water, clear juice, clear broth or bouillon, black coffee or tea, or clear soft drinks or sports drinks. Avoid liquids that contain red or purple dye. The day of the procedure: Do not eat solid foods. You may continue to drink clear liquids until up to 2 hours before the procedure. Do not eat or drink anything starting 2 hours before the procedure, or within the time period that your health care provider recommends. Bowel prep If you were prescribed a bowel prep to take by mouth (orally) to clean out your colon: Take it as told by your health care provider. Starting the day before your procedure, you will need to drink a large amount of liquid medicine. The liquid will cause you to have many bowel movements of loose stool until your stool becomes almost clear or light green. If your skin or the opening between the buttocks (anus) gets irritated from diarrhea, you may relieve the irritation using: Wipes with medicine in them, such as adult wet wipes with aloe and vitamin E. A product to soothe skin, such as petroleum jelly. If you vomit while drinking the bowel  prep: Take a break for up to 60 minutes. Begin the bowel prep again. Call your health care provider if you keep vomiting or you cannot take the bowel prep without vomiting. To clean out your colon, you may also be given: Laxative medicines. These help you have a bowel movement. Instructions for enema use. An enema is liquid medicine injected into your rectum. Medicines Ask your health care provider about: Changing or stopping your regular medicines or supplements. This is especially important if you are taking iron supplements, diabetes medicines, or blood thinners. Taking medicines such as aspirin and ibuprofen. These medicines can thin your blood. Do not take these medicines unless your health care provider tells you to take them. Taking over-the-counter medicines, vitamins, herbs, and supplements. General instructions Ask your health care provider what steps will be taken to help prevent infection. These may include washing skin with a germ-killing soap.  If you will be going home right after the procedure, plan to have a responsible adult: Take you home from the hospital or clinic. You will not be allowed to drive. Care for you for the time you are told. What happens during the procedure?  An IV will be inserted into one of your veins. You will be given a medicine to make you fall asleep (general anesthetic). You will lie on your side with your knees bent. A lubricant will be put on the tube. Then the tube will be: Inserted into your anus. Gently eased through all parts of your large intestine. Air will be sent into your colon to keep it open. This may cause some pressure or cramping. Images will be taken with the camera and will appear on a screen. A small tissue sample may be removed to be looked at under a microscope (biopsy). The tissue may be sent to a lab for testing if any signs of problems are found. If small polyps are found, they may be removed and checked for cancer  cells. When the procedure is finished, the tube will be removed. The procedure may vary among health care providers and hospitals. What happens after the procedure? Your blood pressure, heart rate, breathing rate, and blood oxygen level will be monitored until you leave the hospital or clinic. You may have a small amount of blood in your stool. You may pass gas and have mild cramping or bloating in your abdomen. This is caused by the air that was used to open your colon during the exam. If you were given a sedative during the procedure, it can affect you for several hours. Do not drive or operate machinery until your health care provider says that it is safe. It is up to you to get the results of your procedure. Ask your health care provider, or the department that is doing the procedure, when your results will be ready. Summary A colonoscopy is a procedure to look at the entire large intestine. Follow instructions from your health care provider about eating and drinking before the procedure. If you were prescribed an oral bowel prep to clean out your colon, take it as told by your health care provider. During the colonoscopy, a flexible tube with a camera on its end is inserted into the anus and then passed into all parts of the large intestine. This information is not intended to replace advice given to you by your health care provider. Make sure you discuss any questions you have with your health care provider. Document Revised: 11/06/2021 Document Reviewed: 07/05/2021 Elsevier Patient Education  2023 [employer] Inc.  Monitored Anesthesia Care Anesthesia refers to the techniques, procedures, and medicines that help a person stay safe and comfortable during surgery. Monitored anesthesia care, or sedation, is one type of anesthesia. You may have sedation if you do not need to be asleep for your procedure. Procedures that use sedation may include: Surgery to remove cataracts from your eyes. A  dental procedure. A biopsy. This is when a tissue sample is removed and looked at under a microscope. You will be watched closely during your procedure. Your level of sedation or type of anesthesia may be changed to fit your needs. Tell a health care provider about: Any allergies you have. All medicines you are taking, including vitamins, herbs, eye drops, creams, and over-the-counter medicines. Any problems you or family members have had with anesthesia. Any bleeding problems you have. Any surgeries you have had. Any medical conditions  or illnesses you have. This includes sleep apnea, cough, fever, or the flu. Whether you are pregnant or may be pregnant. Whether you use cigarettes, alcohol, or drugs. Any use of steroids, whether by mouth or as a cream. What are the risks? Your health care provider will talk with you about risks. These may include: Getting too much medicine (oversedation). Nausea. Allergic reactions to medicines. Trouble breathing. If this happens, a breathing tube may be used to help you breathe. It will be removed when you are awake and breathing on your own. Heart trouble. Lung trouble. Confusion that gets better with time (emergence delirium). What happens before the procedure? When to stop eating and drinking Follow instructions from your health care provider about what you may eat and drink. These may include: 8 hours before your procedure Stop eating most foods. Do not eat meat, fried foods, or fatty foods. Eat only light foods, such as toast or crackers. All liquids are okay except energy drinks and alcohol. 6 hours before your procedure Stop eating. Drink only clear liquids, such as water, clear fruit juice, black coffee, plain tea, and sports drinks. Do not drink energy drinks or alcohol. 2 hours before your procedure Stop drinking all liquids. You may be allowed to take medicines with small sips of water. If you do not follow your health care provider's  instructions, your procedure may be delayed or canceled. Medicines Ask your health care provider about: Changing or stopping your regular medicines. These include any diabetes medicines or blood thinners you take. Taking medicines such as aspirin and ibuprofen. These medicines can thin your blood. Do not take them unless your health care provider tells you to. Taking over-the-counter medicines, vitamins, herbs, and supplements. Testing You may have an exam or testing. You may have a blood or urine sample taken. General instructions Do not use any products that contain nicotine or tobacco for at least 4 weeks before the procedure. These products include cigarettes, chewing tobacco, and vaping devices, such as e-cigarettes. If you need help quitting, ask your health care provider. If you will be going home right after the procedure, plan to have a responsible adult: Take you home from the hospital or clinic. You will not be allowed to drive. Care for you for the time you are told. What happens during the procedure?  Your blood pressure, heart rate, breathing, level of pain, and blood oxygen level will be monitored. An IV will be inserted into one of your veins. You may be given: A sedative. This helps you relax. Anesthesia. This will: Numb certain areas of your body. Make you fall asleep for surgery. You will be given medicines as needed to keep you comfortable. The more medicine you are given, the deeper your level of sedation will be. Your level of sedation may be changed to fit your needs. There are three levels of sedation: Mild sedation. At this level, you may feel awake and relaxed. You will be able to follow directions. Moderate sedation. At this level, you will be sleepy. You may not remember the procedure. Deep sedation. At this level, you will be asleep. You will not remember the procedure. How you get the medicines will depend on your age and the procedure. They may be given  as: A pill. This may be taken by mouth (orally) or inserted into the rectum. An injection. This may be into a vein or muscle. A spray through the nose. After your procedure is over, the medicine will be stopped. The  procedure may vary among health care providers and hospitals. What happens after the procedure? Your blood pressure, heart rate, breathing rate, and blood oxygen level will be monitored until you leave the hospital or clinic. You may feel sleepy, clumsy, or nauseous. You may not remember what happened during or after the procedure. Sedation can affect you for several hours. Do not drive or use machinery until your health care provider says that it is safe. This information is not intended to replace advice given to you by your health care provider. Make sure you discuss any questions you have with your health care provider. Document Revised: 04/09/2022 Document Reviewed: 04/09/2022 Elsevier Patient Education  [place].

## 2023-03-19 ENCOUNTER — Encounter (HOSPITAL_COMMUNITY)
Admission: RE | Admit: 2023-03-19 | Discharge: 2023-03-19 | Disposition: A | Discharge status: Home / Self Care | Payer: Medicare Other | Source: Ambulatory Visit | Attending: Internal Medicine | Admitting: Internal Medicine

## 2023-03-19 ENCOUNTER — Encounter (HOSPITAL_COMMUNITY): Payer: Self-pay

## 2023-03-19 ENCOUNTER — Other Ambulatory Visit: Payer: Self-pay

## 2023-03-19 VITALS — BP 148/73 | HR 69 | Temp 97.9°F | Resp 18 | Ht 64.0 in | Wt 230.0 lb

## 2023-03-19 DIAGNOSIS — T502X5A Adverse effect of carbonic-anhydrase inhibitors, benzothiadiazides and other diuretics, initial encounter: Secondary | ICD-10-CM | POA: Insufficient documentation

## 2023-03-19 DIAGNOSIS — I10 Essential (primary) hypertension: Secondary | ICD-10-CM | POA: Diagnosis not present

## 2023-03-19 DIAGNOSIS — Z01818 Encounter for other preprocedural examination: Secondary | ICD-10-CM | POA: Diagnosis present

## 2023-03-19 DIAGNOSIS — E876 Hypokalemia: Secondary | ICD-10-CM

## 2023-03-19 LAB — BASIC METABOLIC PANEL
Anion gap: 8 (ref 5–15)
BUN: 14 mg/dL (ref 8–23)
CO2: 26 mmol/L (ref 22–32)
Calcium: 8.8 mg/dL — ABNORMAL LOW (ref 8.9–10.3)
Chloride: 105 mmol/L (ref 98–111)
Creatinine, Ser: 0.76 mg/dL (ref 0.44–1.00)
GFR, Estimated: >60 mL/min (ref >60)
Glucose, Bld: 97 mg/dL (ref 70–99)
Potassium: 3.9 mmol/L (ref 3.5–5.1)
Sodium: 139 mmol/L (ref 135–145)

## 2023-03-21 ENCOUNTER — Encounter (HOSPITAL_COMMUNITY)
Admission: RE | Disposition: A | Discharge status: Home / Self Care | Payer: Self-pay | Source: Home / Self Care | Attending: Internal Medicine

## 2023-03-21 ENCOUNTER — Encounter (HOSPITAL_COMMUNITY): Payer: Self-pay

## 2023-03-21 ENCOUNTER — Ambulatory Visit (HOSPITAL_COMMUNITY): Payer: Medicare Other | Admitting: Anesthesiology

## 2023-03-21 ENCOUNTER — Ambulatory Visit (HOSPITAL_BASED_OUTPATIENT_CLINIC_OR_DEPARTMENT_OTHER): Payer: Medicare Other | Admitting: Anesthesiology

## 2023-03-21 ENCOUNTER — Ambulatory Visit (HOSPITAL_COMMUNITY)
Admission: RE | Admit: 2023-03-21 | Discharge: 2023-03-21 | Disposition: A | Discharge status: Home / Self Care | Payer: Medicare Other | Attending: Internal Medicine | Admitting: Internal Medicine

## 2023-03-21 DIAGNOSIS — K317 Polyp of stomach and duodenum: Secondary | ICD-10-CM | POA: Insufficient documentation

## 2023-03-21 DIAGNOSIS — E1122 Type 2 diabetes mellitus with diabetic chronic kidney disease: Secondary | ICD-10-CM | POA: Insufficient documentation

## 2023-03-21 DIAGNOSIS — Z86718 Personal history of other venous thrombosis and embolism: Secondary | ICD-10-CM | POA: Diagnosis not present

## 2023-03-21 DIAGNOSIS — J189 Pneumonia, unspecified organism: Secondary | ICD-10-CM

## 2023-03-21 DIAGNOSIS — D123 Benign neoplasm of transverse colon: Secondary | ICD-10-CM | POA: Diagnosis not present

## 2023-03-21 DIAGNOSIS — K259 Gastric ulcer, unspecified as acute or chronic, without hemorrhage or perforation: Secondary | ICD-10-CM | POA: Diagnosis not present

## 2023-03-21 DIAGNOSIS — R1013 Epigastric pain: Secondary | ICD-10-CM | POA: Insufficient documentation

## 2023-03-21 DIAGNOSIS — K219 Gastro-esophageal reflux disease without esophagitis: Secondary | ICD-10-CM | POA: Insufficient documentation

## 2023-03-21 DIAGNOSIS — D126 Benign neoplasm of colon, unspecified: Secondary | ICD-10-CM | POA: Insufficient documentation

## 2023-03-21 DIAGNOSIS — K2950 Unspecified chronic gastritis without bleeding: Secondary | ICD-10-CM | POA: Insufficient documentation

## 2023-03-21 DIAGNOSIS — R002 Palpitations: Secondary | ICD-10-CM | POA: Diagnosis not present

## 2023-03-21 DIAGNOSIS — Z8616 Personal history of COVID-19: Secondary | ICD-10-CM | POA: Insufficient documentation

## 2023-03-21 DIAGNOSIS — K31A11 Gastric intestinal metaplasia without dysplasia, involving the antrum: Secondary | ICD-10-CM | POA: Insufficient documentation

## 2023-03-21 DIAGNOSIS — Z96651 Presence of right artificial knee joint: Secondary | ICD-10-CM | POA: Insufficient documentation

## 2023-03-21 DIAGNOSIS — K635 Polyp of colon: Secondary | ICD-10-CM | POA: Insufficient documentation

## 2023-03-21 DIAGNOSIS — Z8551 Personal history of malignant neoplasm of bladder: Secondary | ICD-10-CM | POA: Diagnosis not present

## 2023-03-21 DIAGNOSIS — D120 Benign neoplasm of cecum: Secondary | ICD-10-CM

## 2023-03-21 DIAGNOSIS — E1159 Type 2 diabetes mellitus with other circulatory complications: Secondary | ICD-10-CM | POA: Insufficient documentation

## 2023-03-21 DIAGNOSIS — Z7985 Long-term (current) use of injectable non-insulin antidiabetic drugs: Secondary | ICD-10-CM | POA: Diagnosis not present

## 2023-03-21 DIAGNOSIS — D125 Benign neoplasm of sigmoid colon: Secondary | ICD-10-CM | POA: Diagnosis not present

## 2023-03-21 DIAGNOSIS — K2970 Gastritis, unspecified, without bleeding: Secondary | ICD-10-CM

## 2023-03-21 DIAGNOSIS — I1310 Hypertensive heart and chronic kidney disease without heart failure, with stage 1 through stage 4 chronic kidney disease, or unspecified chronic kidney disease: Secondary | ICD-10-CM | POA: Diagnosis not present

## 2023-03-21 DIAGNOSIS — N189 Chronic kidney disease, unspecified: Secondary | ICD-10-CM | POA: Diagnosis not present

## 2023-03-21 DIAGNOSIS — Z79899 Other long term (current) drug therapy: Secondary | ICD-10-CM | POA: Insufficient documentation

## 2023-03-21 DIAGNOSIS — K5730 Diverticulosis of large intestine without perforation or abscess without bleeding: Secondary | ICD-10-CM

## 2023-03-21 DIAGNOSIS — Z7984 Long term (current) use of oral hypoglycemic drugs: Secondary | ICD-10-CM | POA: Insufficient documentation

## 2023-03-21 DIAGNOSIS — Z1211 Encounter for screening for malignant neoplasm of colon: Secondary | ICD-10-CM

## 2023-03-21 DIAGNOSIS — D124 Benign neoplasm of descending colon: Secondary | ICD-10-CM

## 2023-03-21 DIAGNOSIS — G4733 Obstructive sleep apnea (adult) (pediatric): Secondary | ICD-10-CM | POA: Insufficient documentation

## 2023-03-21 DIAGNOSIS — K449 Diaphragmatic hernia without obstruction or gangrene: Secondary | ICD-10-CM

## 2023-03-21 HISTORY — PX: COLONOSCOPY WITH PROPOFOL: SHX5780

## 2023-03-21 HISTORY — PX: ESOPHAGOGASTRODUODENOSCOPY (EGD) WITH PROPOFOL: SHX5813

## 2023-03-21 HISTORY — PX: BIOPSY: SHX5522

## 2023-03-21 HISTORY — PX: POLYPECTOMY: SHX5525

## 2023-03-21 LAB — GLUCOSE, CAPILLARY: Glucose-Capillary: 103 mg/dL — ABNORMAL HIGH (ref 70–99)

## 2023-03-21 SURGERY — COLONOSCOPY WITH PROPOFOL
Anesthesia: General

## 2023-03-21 MED ORDER — LIDOCAINE HCL (CARDIAC) PF 100 MG/5ML IV SOSY
PREFILLED_SYRINGE | INTRAVENOUS | Status: DC | PRN
  Administered 2023-03-21: 50 mg via INTRAVENOUS

## 2023-03-21 MED ORDER — PROPOFOL 500 MG/50ML IV EMUL
INTRAVENOUS | Status: DC | PRN
  Administered 2023-03-21: 150 ug/kg/min via INTRAVENOUS

## 2023-03-21 MED ORDER — PROPOFOL 10 MG/ML IV BOLUS
INTRAVENOUS | Status: DC | PRN
  Administered 2023-03-21: 100 mg via INTRAVENOUS

## 2023-03-21 MED ORDER — LACTATED RINGERS IV SOLN: INTRAVENOUS | Status: DC

## 2023-03-21 NOTE — Anesthesia Preprocedure Evaluation (Signed)
Anesthesia Evaluation  Patient identified by MRN, date of birth, ID band Patient awake    Reviewed: Allergy & Precautions, H&P , NPO status , Patient's Chart, lab work & pertinent test results, reviewed documented beta blocker date and time   Airway Mallampati: II  TM Distance: >3 FB Neck ROM: Full    Dental  (+) Caps, Missing, Dental Advisory Given   Pulmonary pneumonia          Cardiovascular Exercise Tolerance: Good hypertension, Pt. on medications and Pt. on home beta blockers + dysrhythmias + Valvular Problems/Murmurs  Rhythm:Regular Rate:Normal + Systolic murmurs ECHO 09/05/2021: 1. Overall left ventricular ejection fraction is estimated at 50 to 55%. 2. Low normal global left ventricular systolic function. 3. Normal right ventricular size and systolic function. 4. There is no evidence of pericardial effusion. 5. Mild mitral annular calcification. 6. Mild mitral valve regurgitation.     Neuro/Psych negative neurological ROS  negative psych ROS   GI/Hepatic Neg liver ROS,GERD  Medicated, Controlled and Poorly Controlled,,  Endo/Other  diabetes (last dose of semaglutide - 03/08/23), Well Controlled, Type 2, Oral Hypoglycemic Agents    Renal/GU Renal disease  negative genitourinary   Musculoskeletal  (+) Arthritis , Osteoarthritis,    Abdominal   Peds negative pediatric ROS (+)  Hematology negative hematology ROS (+)   Anesthesia Other Findings History of bladder cancer  Hypertension  Hypertensive heart disease  Long term prescription benzodiazepine use  Venous insufficiency (chronic) (peripheral)  Sigmoid diverticulosis  Gastroesophageal reflux disease without esophagitis  S/P endoscopy  S/P skin and subcutaneous tissue surgery  Controlled type 2 diabetes mellitus without complication, without long-term current use of insulin (HCC)  History 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  History of COVID-19  OSA (obstructive sleep apnea)  DOE (dyspnea on exertion)  Heart palpitations  PVC's (premature ventricular contractions)  ARB intolerance  Hypoxia  Type 2 diabetes mellitus with other circulatory complications (HCC)  History of total right knee replacement  Osteoarthritis of left knee  Costochondritis  Non-rheumatic mitral regurgitation  Need for shingles vaccine  Leg swelling    Reproductive/Obstetrics negative OB ROS                             Anesthesia Physical Anesthesia Plan  ASA: 3  Anesthesia Plan: General   Post-op Pain Management: Minimal or no pain anticipated   Induction: Intravenous  PONV Risk Score and Plan: 1 and Propofol infusion  Airway Management Planned: Nasal Cannula and Natural Airway  Additional Equipment:   Intra-op Plan:   Post-operative Plan:   Informed Consent: I have reviewed the patients History and Physical, chart, labs and discussed the procedure including the risks, benefits and alternatives for the proposed anesthesia with the patient or authorized representative who has indicated his/her understanding and acceptance.     Dental advisory given  Plan Discussed with: CRNA and Surgeon  Anesthesia Plan Comments:        Anesthesia Quick Evaluation

## 2023-03-21 NOTE — Anesthesia Postprocedure Evaluation (Signed)
Anesthesia Post Note  [name]  Procedure(s) Performed: COLONOSCOPY WITH PROPOFOL ESOPHAGOGASTRODUODENOSCOPY (EGD) WITH PROPOFOL BIOPSY POLYPECTOMY  Patient location during evaluation: Phase II Anesthesia Type: General Level of consciousness: awake and alert and oriented Pain management: pain level controlled Vital Signs Assessment: post-procedure vital signs reviewed and stable Respiratory status: spontaneous breathing, nonlabored ventilation and respiratory function stable Cardiovascular status: blood pressure returned to baseline and stable Postop Assessment: no apparent nausea or vomiting Anesthetic complications: no  No notable events documented.   Last Vitals:  Vitals:   03/21/23 1112 03/21/23 1334  BP: (!) 179/90 (!) 120/49  Pulse: 73 78  Resp: 20 (!) 21  Temp: 36.6 C 36.5 C  SpO2: 99% 99%    Last Pain:  Vitals:   03/21/23 1334  TempSrc: Oral  PainSc: 0-No pain                 Talayah Picardi C Manjinder Breau

## 2023-03-21 NOTE — Op Note (Signed)
[facility] Patient Name: Lindsey Williams Procedure Date: 03/21/2023 12:04 PM MRN: [id number] Date of Birth: 06/24/1946 Attending MD: Hennie Duos. Marletta Lor , [place], [id number] CSN: [id number] Age: 77 Admit Type: Outpatient Procedure:                Upper GI endoscopy Indications:              Epigastric abdominal pain, Heartburn Providers:                Hennie Duos. Marletta Lor, DO, Buel Ream. Museum/gallery exhibitions officer, Charity fundraiser,                            Judeth Cornfield. Jessee Avers, Technician Referring MD:              Medicines:                See the Anesthesia note for documentation of the                            administered medications Complications:            No immediate complications. Estimated Blood Loss:     Estimated blood loss was minimal. Procedure:                Pre-Anesthesia Assessment:                           - The anesthesia plan was to use monitored                            anesthesia care (MAC).                           After obtaining informed consent, the endoscope was                            passed under direct vision. Throughout the                            procedure, the patient's blood pressure, pulse, and                            oxygen saturations were monitored continuously. The                            [id number]-H190 ([id number]) scope was introduced through the                            mouth, and advanced to the second part of duodenum.                            The upper GI endoscopy was accomplished without                            difficulty. The patient tolerated the procedure  well. Scope In: 12:55:59 PM Scope Out: 1:08:52 PM Total Procedure Duration: 0 hours 12 minutes 53 seconds  Findings:      The Z-line was regular.      Diffuse mild inflammation characterized by erythema was found in the       entire examined stomach. Biopsies were taken with a cold forceps for       Helicobacter pylori testing.      A single 25 mm pedunculated polyp  with no bleeding and no stigmata of       recent bleeding was found on the lesser curvature of the stomach. The       polyp was removed with a hot snare. Resection and retrieval were       complete with [facility]. To close a defect after polypectomy, two       hemostatic clips were successfully placed (MR conditional). Clip       manufacturer: [employer]. There was no bleeding at the end of the       procedure.      The duodenal bulb, first portion of the duodenum and second portion of       the duodenum were normal. Impression:               - Z-line regular.                           - Gastritis. Biopsied.                           - A single gastric polyp. Resected and retrieved.                            Clips (MR conditional) were placed. Clip                            manufacturer: [employer].                           - Normal duodenal bulb, first portion of the                            duodenum and second portion of the duodenum. Moderate Sedation:      Per Anesthesia Care Recommendation:           - Patient has a contact number available for                            emergencies. The signs and symptoms of potential                            delayed complications were discussed with the                            patient. Return to normal activities tomorrow.                            Written discharge instructions were provided to the  patient.                           - Resume previous diet.                           - Continue present medications.                           - Await pathology results.                           - Use a proton pump inhibitor PO BID.                           - Return to GI clinic in 3 months. Procedure Code(s):        --- Professional ---                           [phone], Esophagogastroduodenoscopy, flexible,                            transoral; with removal of tumor(s), polyp(s), or                             other lesion(s) by snare technique                           43239, 59, Esophagogastroduodenoscopy, flexible,                            transoral; with biopsy, single or multiple Diagnosis Code(s):        --- Professional ---                           K29.70, Gastritis, unspecified, without bleeding                           K31.7, Polyp of stomach and duodenum                           R10.13, Epigastric pain                           R12, Heartburn CPT copyright 2022 American Medical Association. All rights reserved. The codes documented in this report are preliminary and upon coder review may  be revised to meet current compliance requirements. Hennie Duos. Marletta Lor, DO Hennie Duos. Marletta Lor, DO 03/21/2023 1:11:52 PM This report has been signed electronically. Number of Addenda: 0

## 2023-03-21 NOTE — Interval H&P Note (Signed)
History and Physical Interval Note:  03/21/2023 12:41 PM  Lindsey Williams  has presented today for surgery, with the diagnosis of Genella Rife, hiatal hernia, colon cancer screening.  The various methods of treatment have been discussed with the patient and family. After consideration of risks, benefits and other options for treatment, the patient has consented to  Procedure(s) with comments: COLONOSCOPY WITH PROPOFOL (N/A) - 1:15pm; ASA 3 ESOPHAGOGASTRODUODENOSCOPY (EGD) WITH PROPOFOL (N/A) - 1:15 pm;ASA 3 as a surgical intervention.  The patient's history has been reviewed, patient examined, no change in status, stable for surgery.  I have reviewed the patient's chart and labs.  Questions were answered to the patient's satisfaction.     Lanelle Bal

## 2023-03-21 NOTE — Discharge Instructions (Signed)
EGD Discharge instructions Please read the instructions outlined below and refer to this sheet in the next few weeks. These discharge instructions provide you with general information on caring for yourself after you leave the hospital. Your doctor may also give you specific instructions. While your treatment has been planned according to the most current medical practices available, unavoidable complications occasionally occur. If you have any problems or questions after discharge, please call your doctor. ACTIVITY You may resume your regular activity but move at a slower pace for the next 24 hours.  Take frequent rest periods for the next 24 hours.  Walking will help expel (get rid of) the air and reduce the bloated feeling in your abdomen.  No driving for 24 hours (because of the anesthesia (medicine) used during the test).  You may shower.  Do not sign any important legal documents or operate any machinery for 24 hours (because of the anesthesia used during the test).  NUTRITION Drink plenty of fluids.  You may resume your normal diet.  Begin with a light meal and progress to your normal diet.  Avoid alcoholic beverages for 24 hours or as instructed by your caregiver.  MEDICATIONS You may resume your normal medications unless your caregiver tells you otherwise.  WHAT YOU CAN EXPECT TODAY You may experience abdominal discomfort such as a feeling of fullness or "gas" pains.  FOLLOW-UP Your doctor will discuss the results of your test with you.  SEEK IMMEDIATE MEDICAL ATTENTION IF ANY OF THE FOLLOWING OCCUR: Excessive nausea (feeling sick to your stomach) and/or vomiting.  Severe abdominal pain and distention (swelling).  Trouble swallowing.  Temperature over 101 F (37.8 C).  Rectal bleeding or vomiting of blood.    Colonoscopy Discharge Instructions  Read the instructions outlined below and refer to this sheet in the next few weeks. These discharge instructions provide you with  general information on caring for yourself after you leave the hospital. Your doctor may also give you specific instructions. While your treatment has been planned according to the most current medical practices available, unavoidable complications occasionally occur.   ACTIVITY You may resume your regular activity, but move at a slower pace for the next 24 hours.  Take frequent rest periods for the next 24 hours.  Walking will help get rid of the air and reduce the bloated feeling in your belly (abdomen).  No driving for 24 hours (because of the medicine (anesthesia) used during the test).   Do not sign any important legal documents or operate any machinery for 24 hours (because of the anesthesia used during the test).  NUTRITION Drink plenty of fluids.  You may resume your normal diet as instructed by your doctor.  Begin with a light meal and progress to your normal diet. Heavy or fried foods are harder to digest and may make you feel sick to your stomach (nauseated).  Avoid alcoholic beverages for 24 hours or as instructed.  MEDICATIONS You may resume your normal medications unless your doctor tells you otherwise.  WHAT YOU CAN EXPECT TODAY Some feelings of bloating in the abdomen.  Passage of more gas than usual.  Spotting of blood in your stool or on the toilet paper.  IF YOU HAD POLYPS REMOVED DURING THE COLONOSCOPY: No aspirin products for 7 days or as instructed.  No alcohol for 7 days or as instructed.  Eat a soft diet for the next 24 hours.  FINDING OUT THE RESULTS OF YOUR TEST Not all test results are available  during your visit. If your test results are not back during the visit, make an appointment with your caregiver to find out the results. Do not assume everything is normal if you have not heard from your caregiver or the medical facility. It is important for you to follow up on all of your test results.  SEEK IMMEDIATE MEDICAL ATTENTION IF: You have more than a spotting of  blood in your stool.  Your belly is swollen (abdominal distention).  You are nauseated or vomiting.  You have a temperature over 101.  You have abdominal pain or discomfort that is severe or gets worse throughout the day.   Your EGD revealed mild amount inflammation in your stomach.  I took biopsies of this to rule out infection with a bacteria called H. pylori.  Await pathology results, my office will contact you.  You also had a large polyp in your stomach, likely inflammatory.  I removed this successfully.  To close the defect after polypectomy I did place 2 metallic clips.  These will naturally fall off.  If you need an MRI for any reason in the near future you will need an x-ray prior to ensure clips are no longer present.  Esophagus appeared normal.  Small bowel appeared normal.  Your colonoscopy revealed 4 polyp(s) which I removed successfully. Await pathology results, my office will contact you. I recommend repeating colonoscopy in 5 years for surveillance purposes.   You also have diverticulosis. I would recommend increasing fiber in your diet or adding OTC Benefiber/Metamucil. Be sure to drink at least 4 to 6 glasses of water daily. Follow-up with GI in 3 months   I hope you have a great rest of your week!  Hennie Duos. Marletta Lor, D.O. Gastroenterology and Hepatology [facility]

## 2023-03-21 NOTE — Op Note (Signed)
[facility] Patient Name: Lindsey Williams Procedure Date: 03/21/2023 1:11 PM MRN: [id number] Date of Birth: Aug 29, 1946 Attending MD: Hennie Duos. Marletta Lor , [place], [id number] CSN: [id number] Age: 77 Admit Type: Outpatient Procedure:                Colonoscopy Indications:              Screening for colorectal malignant neoplasm Providers:                Hennie Duos. Marletta Lor, DO, Buel Ream. Museum/gallery exhibitions officer, Charity fundraiser,                            Judeth Cornfield. Jessee Avers, Technician Referring MD:              Medicines:                See the Anesthesia note for documentation of the                            administered medications Complications:            No immediate complications. Estimated Blood Loss:     Estimated blood loss was minimal. Procedure:                Pre-Anesthesia Assessment:                           - The anesthesia plan was to use monitored                            anesthesia care (MAC).                           After obtaining informed consent, the colonoscope                            was passed under direct vision. Throughout the                            procedure, the patient's blood pressure, pulse, and                            oxygen saturations were monitored continuously. The                            [id number]-[id number] ([id number]) scope was introduced through                            the anus and advanced to the the cecum, identified                            by appendiceal orifice and ileocecal valve. The                            colonoscopy was technically difficult and complex                            due  to a redundant colon and significant looping.                            Successful completion of the procedure was aided by                            applying abdominal pressure. The patient tolerated                            the procedure well. The quality of the bowel                            preparation was evaluated using the BBPS [facility]                             Bowel Preparation Scale) with scores of: Right                            Colon = 3, Transverse Colon = 3 and Left Colon = 3                            (entire mucosa seen well with no residual staining,                            small fragments of stool or opaque liquid). The                            total BBPS score equals 9. Scope In: 1:12:42 PM Scope Out: 1:30:56 PM Scope Withdrawal Time: 0 hours 11 minutes 20 seconds  Total Procedure Duration: 0 hours 18 minutes 14 seconds  Findings:      Multiple medium-mouthed diverticula were found in the sigmoid colon.      Two sessile polyps were found in the transverse colon and cecum. The       polyps were 5 to 7 mm in size. These polyps were removed with a cold       snare. Resection and retrieval were complete.      Two sessile polyps were found in the sigmoid colon and descending colon.       The polyps were 4 to 6 mm in size. These polyps were removed with a cold       snare. Resection and retrieval were complete.      The exam was otherwise without abnormality. Impression:               - Diverticulosis in the sigmoid colon.                           - Two 5 to 7 mm polyps in the transverse colon and                            in the cecum, removed with a cold snare. Resected                            and retrieved.                           -  Two 4 to 6 mm polyps in the sigmoid colon and in                            the descending colon, removed with a cold snare.                            Resected and retrieved.                           - The examination was otherwise normal. Moderate Sedation:      Per Anesthesia Care Recommendation:           - Patient has a contact number available for                            emergencies. The signs and symptoms of potential                            delayed complications were discussed with the                            patient. Return to normal activities tomorrow.                             Written discharge instructions were provided to the                            patient.                           - Resume previous diet.                           - Continue present medications.                           - Await pathology results.                           - Repeat colonoscopy in 5 years for surveillance.                           - Return to GI clinic in 3 months. Procedure Code(s):        --- Professional ---                           [phone], Colonoscopy, flexible; with removal of                            tumor(s), polyp(s), or other lesion(s) by snare                            technique Diagnosis Code(s):        --- Professional ---  Z12.11, Encounter for screening for malignant                            neoplasm of colon                           D12.3, Benign neoplasm of transverse colon (hepatic                            flexure or splenic flexure)                           D12.0, Benign neoplasm of cecum                           D12.5, Benign neoplasm of sigmoid colon                           D12.4, Benign neoplasm of descending colon                           K57.30, Diverticulosis of large intestine without                            perforation or abscess without bleeding CPT copyright 2022 American Medical Association. All rights reserved. The codes documented in this report are preliminary and upon coder review may  be revised to meet current compliance requirements. Hennie Duos. Marletta Lor, DO Hennie Duos. Marletta Lor, DO 03/21/2023 1:34:29 PM This report has been signed electronically. Number of Addenda: 0

## 2023-03-21 NOTE — Transfer of Care (Signed)
Immediate Anesthesia Transfer of Care Note  [name]  Procedure(s) Performed: COLONOSCOPY WITH PROPOFOL ESOPHAGOGASTRODUODENOSCOPY (EGD) WITH PROPOFOL BIOPSY POLYPECTOMY  Patient Location: Short Stay  Anesthesia Type:General  Level of Consciousness: awake  Airway & Oxygen Therapy: Patient Spontanous Breathing  Post-op Assessment: Report given to RN and Post -op Vital signs reviewed and stable  Post vital signs: Reviewed and stable  Last Vitals:  Vitals Value Taken Time  BP 120/49 03/21/23 1334  Temp 36.5 C 03/21/23 1334  Pulse 78 03/21/23 1334  Resp 21 03/21/23 1334  SpO2 99 % 03/21/23 1334    Last Pain:  Vitals:   03/21/23 1334  TempSrc: Oral  PainSc: 0-No pain         Complications: No notable events documented.

## 2023-03-27 ENCOUNTER — Ambulatory Visit (INDEPENDENT_AMBULATORY_CARE_PROVIDER_SITE_OTHER): Payer: Medicare Other | Admitting: Orthopedic Surgery

## 2023-03-27 DIAGNOSIS — G8929 Other chronic pain: Secondary | ICD-10-CM | POA: Diagnosis not present

## 2023-03-27 DIAGNOSIS — M25511 Pain in right shoulder: Secondary | ICD-10-CM

## 2023-03-29 ENCOUNTER — Encounter: Payer: Self-pay | Admitting: Orthopedic Surgery

## 2023-03-29 LAB — SURGICAL PATHOLOGY

## 2023-03-29 NOTE — Progress Notes (Signed)
Office Visit Note   [name]           Date of Birth: August 31, 1946           MRN: [id number] Visit Date: 03/27/2023 Requested by: Ave Filter, MD [address] [place],  [place] [address] PCP: Valla Leaver, MD  Subjective: Chief Complaint  Patient presents with   Right Shoulder - Pain    HPI: Lindsey Williams is a 77 y.o. female who presents to the office reporting right shoulder pain of 2 to 3 years duration.  Been much more severe recently.  She is right-hand dominant.  She also reports right elbow pain and has had elbow and shoulder injections which only give 2 weeks of relief.  She describes decreased range of motion in the shoulder.  Also reports some numbness and tingling in the hand but no neck pain.  Has a history of carpal tunnel release in the 90s as well as ulnar nerve surgery as well.  That was done years ago.  She has had an MRI scan of the right shoulder which shows complete tear of the supraspinatus tendon with retraction and atrophy.  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  Infraspinatus tearing and atrophy also present.  EMG nerve study shows mild to moderate right median nerve entrapment at the wrist.  She states the pain does wake her from sleep at night.  She lives by herself.  She does have a history of bilateral total knee replacements which she did well with.  Describes weakness with any overhead motion..                ROS: All systems reviewed are negative as they relate to the chief complaint within the history of present illness.  Patient denies fevers or chills.  Assessment & Plan: Visit Diagnoses:  1. 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  The cuff tear is not repairable.  Significant atrophy is present both in the infraspinatus and supraspinatus.  Plan at this time is thin cut CT scan to evaluate for reverse shoulder replacement.  Come back in  about 2 weeks and we can talk about that more.  I did show her models and explained to her some of the rehabilitation required.  Follow-Up Instructions: No follow-ups on file.   Orders:  Orders Placed This Encounter  Procedures   CT SHOULDER RIGHT WO CONTRAST   No orders of the defined types were placed in this encounter.     Procedures: No procedures performed   Clinical Data: No additional findings.  Objective: Vital Signs: There were no vitals taken for this visit.  Physical Exam:  Constitutional: Patient appears well-developed HEENT:  Head: Normocephalic Eyes:EOM are normal Neck: Normal range of motion Cardiovascular: Normal rate Pulmonary/chest: Effort normal Neurologic: Patient is alert Skin: Skin is warm Psychiatric: Patient has normal mood and affect  Ortho Exam: Ortho exam demonstrates good cervical spine range of motion.  Passive range of motion on the right is 70/90/160.  She does have weakness to infraspinatus and supraspinatus testing on the right but subscap strength is 5+ out of 5.  No Popeye deformity and she does have forward flexion and abduction above 90 degrees but definitely some weakness with any resistance testing above that level.  Has well-healed surgical incision around the medial elbow.  Also has carpal tunnel incision which is well-healed.  No abductor pollicis  brevis wasting or interosseous wasting.  Grip strength EPL FPL interosseous strength is good in the right wrist.  Radial pulse intact bilaterally.  Specialty Comments:  No specialty comments available.  Imaging: No results found.   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 No pertinent family history.  Past Surgical History:  Procedure Laterality Date   ABDOMINAL HYSTERECTOMY     1989   ARCUATE KERATECTOMY     BREAST BIOPSY     BIL   CARDIAC CATHETERIZATION     8 YRS AGO/ STRESS ECHO 8 YRS AGO   CARPAL TUNNEL RELEASE     BILATERAL   CHOLECYSTECTOMY     CYSTOSCOPY     YEARLY   HEMORROIDECTOMY     INGUINAL HERNIA REPAIR     JOINT REPLACEMENT     RT TOTAL KNEE 2011   KNEE ARTHROPLASTY  10/05/2011   Procedure: COMPUTER ASSISTED TOTAL KNEE ARTHROPLASTY;  Surgeon: Kathryne Hitch;  Location: [facility];  Service: Orthopedics;  Laterality: Left;  preop femoral nerve block   NONE  09/26/11   NO PREVIOUS SURG   NONE     ULNAR NERVE REPAIR     BIL   WRIST ARTHROSCOPY     BIL   Social History   Occupational History   Not on file  Tobacco Use   Smoking status: Never    Passive exposure: Past   Smokeless tobacco: Never  Substance and Sexual Activity   Alcohol use: No   Drug use: No   Sexual activity: Not on file

## 2023-04-02 ENCOUNTER — Encounter (HOSPITAL_COMMUNITY): Payer: Self-pay | Admitting: Internal Medicine

## 2023-04-02 ENCOUNTER — Telehealth: Payer: Self-pay | Admitting: Orthopedic Surgery

## 2023-04-02 NOTE — Telephone Encounter (Signed)
Patient called asked if she can get an CT scan of both shoulders? Patient said her left shoulder is hurting pretty bad as well. The number to contact patient is [phone] or [phone]

## 2023-04-02 NOTE — Telephone Encounter (Signed)
Thank you :)

## 2023-04-02 NOTE — Telephone Encounter (Signed)
We can scan both shoulders if she think she wants to get the other shoulder replaced as well.  The whole reason for the scan on the shoulder is for preoperative templating for the replacement.  The scans last 6 months so if she thinks she wants to get the other shoulder done within 6 months I think it is okay to scan the shoulder that we are not operating on as well as the other shoulder but if she thinks she wants to wait it out then there is no point in scanning the nonoperative shoulder.  Please call thanks

## 2023-04-09 ENCOUNTER — Encounter: Payer: Self-pay | Admitting: Internal Medicine

## 2023-04-17 ENCOUNTER — Ambulatory Visit: Payer: PRIVATE HEALTH INSURANCE | Admitting: Orthopedic Surgery

## 2023-05-02 ENCOUNTER — Ambulatory Visit: Payer: PRIVATE HEALTH INSURANCE | Admitting: Gastroenterology

## 2023-05-07 ENCOUNTER — Ambulatory Visit
Admission: RE | Admit: 2023-05-07 | Discharge: 2023-05-07 | Disposition: A | Discharge status: Home / Self Care | Payer: Medicare Other | Source: Ambulatory Visit | Attending: Orthopedic Surgery | Admitting: Orthopedic Surgery

## 2023-05-07 DIAGNOSIS — G8929 Other chronic pain: Secondary | ICD-10-CM

## 2023-05-10 ENCOUNTER — Ambulatory Visit (INDEPENDENT_AMBULATORY_CARE_PROVIDER_SITE_OTHER): Payer: Medicare Other | Admitting: Orthopedic Surgery

## 2023-05-10 DIAGNOSIS — M75101 Unspecified rotator cuff tear or rupture of right shoulder, not specified as traumatic: Secondary | ICD-10-CM

## 2023-05-10 DIAGNOSIS — M75121 Complete rotator cuff tear or rupture of right shoulder, not specified as traumatic: Secondary | ICD-10-CM | POA: Diagnosis not present

## 2023-05-10 DIAGNOSIS — M12811 Other specific arthropathies, not elsewhere classified, right shoulder: Secondary | ICD-10-CM | POA: Diagnosis not present

## 2023-05-11 ENCOUNTER — Encounter: Payer: Self-pay | Admitting: Orthopedic Surgery

## 2023-05-11 NOTE — Progress Notes (Signed)
Office Visit Note   [name]           Date of Birth: Apr 28, 1946           MRN: [id number] Visit Date: 05/10/2023 Requested by: Valla Leaver, MD [id number] [place],  [place] [address] PCP: Valla Leaver, MD  Subjective: Chief Complaint  Patient presents with   Other     Scan review    HPI: Lindsey Williams is a 77 y.o. female who presents to the office reporting right shoulder pain.  Since she was last seen she had a CT scan which shows moderate glenohumeral joint arthritis with findings consistent with rotator cuff arthropathy.  Patient has been sick with bronchitis over the past week.  Patient reports a lot of pain at night.  She feels a catching in the right shoulder.  She also describes weakness with any type of overhead motion.  This is something that is giving her enough functional disability that she does not really want to live with it.  She has tried and failed conservative treatment measures..                ROS: All systems reviewed are negative as they relate to the chief complaint within the history of present illness.  Patient denies fevers or chills.  Assessment & Plan: Visit Diagnoses:  1. Nontraumatic complete tear of right rotator cuff   2. Right rotator cuff tear arthropathy     Plan: Impression is rotator cuff arthropathy right shoulder with fairly maintained passive motion as well as forward flexion and abduction.  She does have significant pain greater than weakness but there is also component of weakness with overhead attempts at motion.  Plan at this time is right shoulder reverse shoulder replacement and biceps tenodesis.  The risk and benefits are discussed with the patient including not limited to infection or vessel damage incomplete pain relief as well as incomplete restoration of function.  Expected nature of the rehabilitative process is also discussed including the use of a CPM brace or chair.  Patient understands the  risk and benefits and wishes to proceed.  All questions answered  Follow-Up Instructions: No follow-ups on file.   Orders:  No orders of the defined types were placed in this encounter.  No orders of the defined types were placed in this encounter.     Procedures: No procedures performed   Clinical Data: No additional findings.  Objective: Vital Signs: There were no vitals taken for this visit.  Physical Exam:  Constitutional: Patient appears well-developed HEENT:  Head: Normocephalic Eyes:EOM are normal Neck: Normal range of motion Cardiovascular: Normal rate Pulmonary/chest: Effort normal Neurologic: Patient is alert Skin: Skin is warm Psychiatric: Patient has normal mood and affect  Ortho Exam: Ortho exam demonstrates passive range of motion of 70/90/160.  Does have some weakness to infraspinatus and supraspinatus testing on the right but subscap strength is 5+ out of 5.  No Popeye deformity but forward flexion and abduction are achievable over 90 degrees.  There is weakness with overhead resistance testing.  Does have tenderness in the bicipital groove but no AC joint tenderness.  Specialty Comments:  No specialty comments available.  Imaging: No results found.   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  Past Surgical History:  Procedure Laterality Date    ABDOMINAL HYSTERECTOMY     1989   ARCUATE KERATECTOMY     BIOPSY  03/21/2023   Procedure: BIOPSY;  Surgeon: Lanelle Bal, DO;  [facility];  Service: Gastroenterology;;   BREAST BIOPSY     BIL   CARDIAC CATHETERIZATION     8 YRS AGO/ STRESS ECHO 8 YRS AGO   CARPAL TUNNEL RELEASE     BILATERAL   CHOLECYSTECTOMY     COLONOSCOPY WITH PROPOFOL N/A 03/21/2023   Procedure: COLONOSCOPY WITH PROPOFOL;  Surgeon: Lanelle Bal, DO;  [facility];  Service: Gastroenterology;  Laterality: N/A;  1:15pm; ASA 3   CYSTOSCOPY     YEARLY   ESOPHAGOGASTRODUODENOSCOPY (EGD) WITH PROPOFOL N/A 03/21/2023   Procedure: ESOPHAGOGASTRODUODENOSCOPY (EGD) WITH PROPOFOL;  Surgeon: Lanelle Bal, DO;  [facility];  Service: Gastroenterology;  Laterality: N/A;  1:15 pm;ASA 3   HEMORROIDECTOMY     INGUINAL HERNIA REPAIR     JOINT REPLACEMENT     RT TOTAL KNEE 2011   KNEE ARTHROPLASTY  10/05/2011   Procedure: COMPUTER ASSISTED TOTAL KNEE ARTHROPLASTY;  Surgeon: Kathryne Hitch;  Location: [facility];  Service: Orthopedics;  Laterality: Left;  preop femoral nerve block   NONE  09/26/11   NO PREVIOUS SURG   NONE     POLYPECTOMY  03/21/2023   Procedure: POLYPECTOMY;  Surgeon: Lanelle Bal, DO;  [facility];  Service: Gastroenterology;;   ULNAR NERVE REPAIR     BIL   WRIST ARTHROSCOPY     BIL   Social History   Occupational History   Not on file  Tobacco Use   Smoking status: Never    Passive exposure: Past   Smokeless tobacco: Never  Substance and Sexual Activity   Alcohol use: No   Drug use: No   Sexual activity: Not on file

## 2023-05-13 ENCOUNTER — Ambulatory Visit (INDEPENDENT_AMBULATORY_CARE_PROVIDER_SITE_OTHER): Payer: Medicare Other | Admitting: Gastroenterology

## 2023-05-13 ENCOUNTER — Encounter: Payer: Self-pay | Admitting: Gastroenterology

## 2023-05-13 VITALS — BP 135/83 | HR 78 | Temp 97.9°F | Ht 64.0 in | Wt 226.8 lb

## 2023-05-13 DIAGNOSIS — K219 Gastro-esophageal reflux disease without esophagitis: Secondary | ICD-10-CM

## 2023-05-13 DIAGNOSIS — R002 Palpitations: Secondary | ICD-10-CM

## 2023-05-13 DIAGNOSIS — Z86010 Personal history of colonic polyps: Secondary | ICD-10-CM | POA: Diagnosis not present

## 2023-05-13 MED ORDER — OMEPRAZOLE 40 MG PO CPDR: 40.0000 mg | DELAYED_RELEASE_CAPSULE | Freq: Two times a day (BID) | ORAL | 3 refills | Status: DC

## 2023-05-13 NOTE — Progress Notes (Signed)
GI Office Note    Referring Provider: Gennie Alma* Primary Care Physician:  Valla Leaver, MD Primary Gastroenterologist: Hennie Duos. Marletta Lor, DO  Date:  05/13/2023  ID:  Lindsey Williams, DOB 08-23-1946, MRN [id number]   Chief Complaint   Chief Complaint  Patient presents with   Follow-up    Follow up after procedures   History of Present Illness  Lindsey Williams is a 77 y.o. female with a history of HTN, arthritis, diabetes, CKD, bladder cancer, constipation presenting today for follow-up post upper endoscopy and colonoscopy.  Appears patient had a colonoscopy/sigmoidoscopy in 2007 at [facility] but report not available.    Labs 07/24/22: CBC and CMP unremarkable. A1c 5   Per referral notes patient ports cough and epigastric discomfort at times as well as a burning sensation in her throat and her stomach for several years that is worse with spicy and greasy foods.  Also with morning nausea and a lot of belching.  Reportedly was taking Protonix 40 mg twice daily for years which did not seem to help.  Also having heart palpitations at times and felt that was causing her burning pain in her chest.  No reports an EGD and colonoscopy done in May 2019 for similar complaints and small hiatal hernia noted at that time by Dr. Cristy Folks.  PCP had advised for her to start taking Prilosec 40 mg twice daily for couple weeks and adding Carafate and obtain upper GI series.  Advised to consider EGD pending these results.    UGI series October 2023 with frequent uncoordinated tertiary esophageal contractions.   Initial consultation here 02/19/2023.  Patient reported ongoing chest pain/palpitations with evaluations by cardiology who continues to report everything is normal and that this is secondary to GI issue.  Has had previous upper endoscopies noting hiatal hernia.  Her palpitations continued despite Protonix in the past with her prior GI.  Had new cardiac evaluation who performed the  previous evaluation as her prior cardiologist including echo, stress test, cardiac cath, and CTA.  Was unable to tolerate Lipitor or Crestor.  Has tried multiple different medications including nadolol and carvedilol which she reports has made her heart rate increase.  She came to the office at the recommendation of cardiology given his so they feel like this is reflux related and she reported multiple prior upper GI series in the past without reflux or hernia and her palpitations are most notable at night and she feels as though it is her heart that is a problem.  Has been taking Prilosec without issue.  Denied any nausea, vomiting, or dysphagia.  Reportedly had gained 10 pounds since being without her Ozempic.  Does have some mild diarrhea/urgency with roughage which she tries to avoid.  Noted to be due for colonoscopy in May.  To further evaluate her symptoms she was scheduled for an EGD as well as a colonoscopy given she was pretty much due for surveillance anyway.  She was advised to continue omeprazole 40 mg once daily and continue follow-up with PCP and cardiology.  EGD 03/21/2023: -Gastritis s/p biopsy -Single gastric polyp s/p removal and clip placement -Normal duodenum -Advised use PPI twice daily -Pathology with focal intestinal metaplasia, inactive gastritis and reactive chemical gastropathy, H. pylori negative.  Polyp removed from gallbladder was hyperplastic  Colonoscopy 03/21/2023: -Sigmoid diverticulosis -(2) 5 to 7 mm polyps in the transverse colon and cecum -Two 4-6 mm polyps in the sigmoid and descending colon -Pathology with 1 fragment of tubular adenoma and a  hyperplastic polyp from cecal and transverse polypectomy -Detached fragment of gastric, transitional type mucosa with chronic gastritis present, negative for H. pylori, 1 tubular adenoma and 2 fragments of hyperplastic and polypoid lymphoid aggregate present from descending and sigmoid polyps without dysplasia -Advised repeat  colonoscopy in 5 years.  Today: Had issue with phone tree regarding her visit today and also was not told of right location for her visit today. She said that she tried to hit all kinds of different options to speak to a receptionist and it was never sent over.   Has an appointment with cardiology on 6/24 given she is on nadalol (was on toprol previously without issue other than palpitations). Does not have palpitations during the day but feels like it flutters in the mornings from taking the nadalol at night and felt like her heart was beating out of her chest after the dose was increased by cardiology.   Has a mild burning type of sensation to the epigastrium and some mild nausea in the mornings. Has a hollow feeling to her stomach at times. For the first couple weeks she had some mild burning sensation. Has only been tacking the omeprazole once daily. And she states that seems to be fine. Avoids caffeine. She used to get the same feeling with caffeine before but right now does not drink it. Can never have artificial sweetener that she feels like she gets a migraine from it or has a mild allergic reaction. If she has drank a diet soda before she will get a migraine. Artificial sweetener in gum numbs her mouth.   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 That went away as time went on. She started dr Marletta Lor reported that her colon was a little tangled and he had to use gas to open it up. She was told to drink water and pass gas to get it to go away and she states it took a little while for it to go away. She felt very bloated after the procedure. She states a week later she developed a sinus infection and she said everything on her face hurt. She states she went to the eye doctor and had everything with her eyes checked for the blurry vision and everything came back negative. She was given 2 weeks of abc (amoxicillin) and then she was fine and then a few weeks later she started  having facial pain again and then she felt like she had the flu and went to an urgent care and was given prescription mucinex and cough syrup and had a respiratory panel and everything was negative. (Diagnosed with presumed bronchitis). Slowly getting over it. At one point she had no appetite and was weak but once she stopped taking everything she was better. (Took it for 5-6 days). The last 4 days she has had more energy.   She had a great experience with her procedures and the staff at [facility].   Switched to [employer] from [employer] given availability.    Current Outpatient Medications  Medication Sig Dispense Refill   ALPRAZolam (XANAX) 0.5 MG tablet Take 0.5 mg by mouth 2 (two) times daily as needed for anxiety.     aspirin EC 81 MG tablet Take 81 mg by mouth daily.     calcium-vitamin D (OSCAL WITH D) 500-200 MG-UNIT per tablet Take 0.5 tablets by mouth daily.       Cholecalciferol (D-3-5) 5000 units capsule Take 5,000 Units by mouth daily.  furosemide (LASIX) 20 MG tablet Take 40 mg by mouth daily.     MOUNJARO 2.5 MG/0.5ML Pen Inject into the skin.     nadolol (CORGARD) 40 MG tablet Take 60 mg by mouth daily.     omeprazole (PRILOSEC) 40 MG capsule Take 1 capsule (40 mg total) by mouth daily. 90 capsule 3   potassium chloride SA (KLOR-CON M) 20 MEQ tablet Take 20 mEq by mouth daily.     acetaminophen-codeine (TYLENOL #3) 300-30 MG tablet Take 1-2 tablets by mouth every 8 (eight) hours as needed for moderate pain. (Patient not taking: Reported on 05/13/2023) 30 tablet 0   No current facility-administered medications for this visit.    Past Medical History:  Diagnosis Date   Chronic kidney disease    BLADDER CANCER - FOLLOWED YEARLY   Diabetes mellitus    "PRE DIABETIC" DIET CONTROLLED   Dysrhythmia    UNK ARRYTHMIA    Hypertension    Nausea & vomiting    No pertinent past medical history CONSTIPATION AND EPIGASTRIC PAIN   EPIGASTRIC PAIN   Osteoarthritis of left knee 10/07/2011    Pneumonia    HX PE 1972 SUPERFICIAL PHLEBITIS 2010    Past Surgical History:  Procedure Laterality Date   ABDOMINAL HYSTERECTOMY     1989   ARCUATE KERATECTOMY     BIOPSY  03/21/2023   Procedure: BIOPSY;  Surgeon: Lanelle Bal, DO;  [facility];  Service: Gastroenterology;;   BREAST BIOPSY     BIL   CARDIAC CATHETERIZATION     8 YRS AGO/ STRESS ECHO 8 YRS AGO   CARPAL TUNNEL RELEASE     BILATERAL   CHOLECYSTECTOMY     COLONOSCOPY WITH PROPOFOL N/A 03/21/2023   Procedure: COLONOSCOPY WITH PROPOFOL;  Surgeon: Lanelle Bal, DO;  [facility];  Service: Gastroenterology;  Laterality: N/A;  1:15pm; ASA 3   CYSTOSCOPY     YEARLY   ESOPHAGOGASTRODUODENOSCOPY (EGD) WITH PROPOFOL N/A 03/21/2023   Procedure: ESOPHAGOGASTRODUODENOSCOPY (EGD) WITH PROPOFOL;  Surgeon: Lanelle Bal, DO;  [facility];  Service: Gastroenterology;  Laterality: N/A;  1:15 pm;ASA 3   HEMORROIDECTOMY     INGUINAL HERNIA REPAIR     JOINT REPLACEMENT     RT TOTAL KNEE 2011   KNEE ARTHROPLASTY  10/05/2011   Procedure: COMPUTER ASSISTED TOTAL KNEE ARTHROPLASTY;  Surgeon: Kathryne Hitch;  Location: [facility];  Service: Orthopedics;  Laterality: Left;  preop femoral nerve block   NONE  09/26/11   NO PREVIOUS SURG   NONE     POLYPECTOMY  03/21/2023   Procedure: POLYPECTOMY;  Surgeon: Lanelle Bal, DO;  [facility];  Service: Gastroenterology;;   ULNAR NERVE REPAIR     BIL   WRIST ARTHROSCOPY     BIL    History reviewed. No pertinent family history.  Allergies as of 05/13/2023 - Review Complete 05/13/2023  Allergen Reaction Noted   Erythromycin Shortness Of Breath and Other (See Comments) 09/24/2011   Tape  10/05/2011   Epinephrine  03/04/2017   Losartan Cough 10/09/2019   Other Nausea And Vomiting 01/28/2017    Social History   Socioeconomic History   Marital status: Single    Spouse name: Not on file   Number of children: Not on file    Years of education: Not on file   Highest education level: Not on file  Occupational History   Not on file  Tobacco Use  Smoking status: Never    Passive exposure: Past   Smokeless tobacco: Never  Substance and Sexual Activity   Alcohol use: No   Drug use: No   Sexual activity: Not on file  Other Topics Concern   Not on file  Social History Narrative   Not on file   Social Determinants of Health   Financial Resource Strain: Not on file  Food Insecurity: Not on file  Transportation Needs: Not on file  Physical Activity: Not on file  Stress: Not on file  Social Connections: Not on file     Review of Systems   Gen: Denies fever, chills, anorexia. Denies fatigue, weakness, weight loss.  CV: + palpitations. Denies chest pain, syncope, peripheral edema, and claudication. Resp: + cough. Denies dyspnea at rest, wheezing, coughing up blood, and pleurisy. GI: See HPI Derm: Denies rash, itching, dry skin Psych: Denies depression, anxiety, memory loss, confusion. No homicidal or suicidal ideation.  Heme: Denies bruising, bleeding, and enlarged lymph nodes.  Physical Exam   BP 135/83   Pulse 78   Temp 97.9 F (36.6 C)   Ht 5\' 4"  (1.626 m)   Wt 226 lb 12.8 oz (102.9 kg)   BMI 38.93 kg/m   General:   Alert and oriented. No distress noted. Pleasant and cooperative.  Head:  Normocephalic and atraumatic. Eyes:  Conjuctiva clear without scleral icterus. Mouth:  Oral mucosa pink and moist. Good dentition. No lesions. Abdomen:  +BS, soft, non-tender and non-distended. No rebound or guarding. No HSM or masses noted.  Rectal: deferred Msk:  Symmetrical without gross deformities. Normal posture. Extremities:  Without edema. Neurologic:  Alert and  oriented x4 Psych:  Alert and cooperative. Normal mood and affect.   Assessment  Lindsey Williams is a 77 y.o. female with a history of HTN, arthritis, diabetes, CKD, bladder cancer, constipation presenting today for follow-up post upper  endoscopy and colonoscopy.  GERD, palpitations: Recent EGD with gastritis as well as gastric polyp revealed to be hyperplastic.  Was recommended to take omeprazole twice daily but she is continue to only take once daily.  She does have some occasional nausea in the mornings and a hollow feeling/burning sensation that occurs at times in the epigastric region.  No tenderness on exam today.  Has been trying to follow a GERD diet, does not take any NSAIDs and has avoided caffeine.  Does have some occasional palpitations that have continued however she feels as though this is related to the nadolol and she is following up with cardiology on the 24th of this month.  We will increase her omeprazole to twice daily for 4 weeks and monitor for improvement or worsening of nausea and epigastric burning as well as palpitations.  She is to call with an update.  Prescription updated today.  History of adenomatous colon polyps: Recent colonoscopy with 4 polyps removed, hyperplastic and tubular adenomas. Advised repeat in 5 years, health permitting.   PLAN   Continue omeprazole 40 mg daily, can consider increasing to twice daily to help with nausea and burning if needed.  Avoids NSAIDs and caffeine GERD diet Continue to follow with cardiology for palpitations Colonoscopy in 5 years Follow up in 6 months.    Brooke Bonito, MSN, FNP-BC, AGACNP-BC [facility]

## 2023-05-13 NOTE — Patient Instructions (Addendum)
Continue omeprazole 40 mg but increase to twice daily for 4 weeks and then reduce back to once daily and monitor for improvement/worsening of burning/nausea.   Avoids NSAIDs and caffeine.  Follow a GERD diet:  Avoid fried, fatty, greasy, spicy, citrus foods. Avoid caffeine and carbonated beverages. Avoid chocolate. Try eating 4-6 small meals a day rather than 3 large meals. Do not eat within 3 hours of laying down. Prop head of bed up on wood or bricks to create a 6 inch incline.  Continue to follow with cardiology.   It was a pleasure to see you today. I want to create trusting relationships with patients. If you receive a survey regarding your visit,  I greatly appreciate you taking time to fill this out on paper or through your MyChart. I value your feedback.  Brooke Bonito, MSN, FNP-BC, AGACNP-BC [facility]

## 2023-05-21 ENCOUNTER — Telehealth: Payer: Self-pay | Admitting: Orthopedic Surgery

## 2023-05-21 NOTE — Telephone Encounter (Signed)
Called patient regarding a surgery date for right reverse shoulder arthroplasty.  She is in a theatre at the moment and unable to talk, and asked that I call tomorrow.  Patient did say the right shoulder injury was work related.

## 2023-05-31 ENCOUNTER — Telehealth: Payer: Self-pay | Admitting: Radiology

## 2023-05-31 NOTE — Telephone Encounter (Signed)
Patient called back to s/w [name] for surgery scheduling.  [name] to call her back.

## 2023-07-03 ENCOUNTER — Ambulatory Visit (INDEPENDENT_AMBULATORY_CARE_PROVIDER_SITE_OTHER): Payer: Medicare Other | Admitting: Surgical

## 2023-07-03 ENCOUNTER — Encounter: Payer: Self-pay | Admitting: Orthopedic Surgery

## 2023-07-03 DIAGNOSIS — M75121 Complete rotator cuff tear or rupture of right shoulder, not specified as traumatic: Secondary | ICD-10-CM

## 2023-07-03 NOTE — Progress Notes (Signed)
Office Visit Note   [name]           Date of Birth: [birth year]           MRN: [id number] Visit Date: 07/03/2023 Requested by: Valla Leaver, MD [id number] [place],  [place] [address] PCP: Valla Leaver, MD  Subjective: Chief Complaint  Patient presents with   Other    Discuss surgical questions    HPI: Lindsey Williams is a 77 y.o. female who presents to the office reporting right shoulder pain.  Patient is currently scheduled for right reverse shoulder arthroplasty on 07/18/2023.  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  She is here today to review CT scan of the right shoulder and discuss surgery further.  She already discussed surgery and a fairly in-depth extent with Dr. August Saucer.  She denies any lung disease, oxygen use.  Takes 81 mg aspirin.  She has recently seen her cardiologist about 2 weeks ago and she states that they are okay with her proceeding with surgery and she was just informed to continue with her blood pressure medications..                ROS: All systems reviewed are negative as they relate to the chief complaint within the history of present illness.  Patient denies fevers or chills.  Assessment & Plan: Visit Diagnoses:  1. Nontraumatic complete tear of right rotator cuff     Plan: Patient is a 77 year old female who is currently planned for right reverse shoulder arthroplasty on 07/18/2023.  We discussed the concept behind reverse shoulder replacement with the use of models and discussed the process on the day of surgery and likely timeline of her stay at the hospital.  We also discussed the risks and benefits of the procedure including but not limited to the risk of nerve/blood vessel damage, infection, refracture during surgery, medical complication from surgery, continued shoulder stiffness and pain, need for revision surgery in the future.  We discussed  the recovery time frame and the ultimate lifting restriction of 30 pounds in this arm.  All of her questions were answered to her satisfaction.  Follow-up after procedure.  Follow-Up Instructions: Return for After procedure.   Orders:  No orders of the defined types were placed in this encounter.  No orders of the defined types were placed in this encounter.     Procedures: No procedures performed   Clinical Data: No additional findings.  Objective: Vital Signs: There were no vitals taken for this visit.  Physical Exam:  Constitutional: Patient appears well-developed HEENT:  Head: Normocephalic Eyes:EOM are normal Neck: Normal range of motion Cardiovascular: Normal rate Pulmonary/chest: Effort normal Neurologic: Patient is alert Skin: Skin is warm Psychiatric: Patient has normal mood and affect  Ortho Exam: Ortho exam demonstrates right shoulder with 40 degrees X rotation, 100 degrees abduction, 160 degrees forward elevation passively.  Axillary nerve intact with deltoid firing.  No cellulitis or skin changes noted over the right shoulder region where the incision will be.  2+ radial pulse of the right upper extremity.  Specialty Comments:  No specialty comments available.  Imaging: No results found.   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 No pertinent family  history.  Past Surgical History:  Procedure Laterality Date   ABDOMINAL HYSTERECTOMY     1989   ARCUATE KERATECTOMY     BIOPSY  03/21/2023   Procedure: BIOPSY;  Surgeon: Lanelle Bal, DO;  [facility];  Service: Gastroenterology;;   BREAST BIOPSY     BIL   CARDIAC CATHETERIZATION     8 YRS AGO/ STRESS ECHO 8 YRS AGO   CARPAL TUNNEL RELEASE     BILATERAL   CHOLECYSTECTOMY     COLONOSCOPY WITH PROPOFOL N/A 03/21/2023   Procedure: COLONOSCOPY WITH PROPOFOL;  Surgeon: Lanelle Bal, DO;  [facility];  Service: Gastroenterology;  Laterality: N/A;  1:15pm; ASA 3   CYSTOSCOPY     YEARLY   ESOPHAGOGASTRODUODENOSCOPY (EGD) WITH PROPOFOL N/A 03/21/2023   Procedure: ESOPHAGOGASTRODUODENOSCOPY (EGD) WITH PROPOFOL;  Surgeon: Lanelle Bal, DO;  [facility];  Service: Gastroenterology;  Laterality: N/A;  1:15 pm;ASA 3   HEMORROIDECTOMY     INGUINAL HERNIA REPAIR     JOINT REPLACEMENT     RT TOTAL KNEE 2011   KNEE ARTHROPLASTY  10/05/2011   Procedure: COMPUTER ASSISTED TOTAL KNEE ARTHROPLASTY;  Surgeon: Kathryne Hitch;  Location: [facility];  Service: Orthopedics;  Laterality: Left;  preop femoral nerve block   NONE  09/26/11   NO PREVIOUS SURG   NONE     POLYPECTOMY  03/21/2023   Procedure: POLYPECTOMY;  Surgeon: Lanelle Bal, DO;  [facility];  Service: Gastroenterology;;   ULNAR NERVE REPAIR     BIL   WRIST ARTHROSCOPY     BIL   Social History   Occupational History   Not on file  Tobacco Use   Smoking status: Never    Passive exposure: Past   Smokeless tobacco: Never  Substance and Sexual Activity   Alcohol use: No   Drug use: No   Sexual activity: Not on file

## 2023-07-05 NOTE — Progress Notes (Addendum)
Surgical Instructions    Your procedure is scheduled on AUGUST 22,2024.  Report to [facility] Main Entrance "A" at 5:30 A.M., then check in with the Admitting office.  Call this number if you have problems the morning of surgery:  [phone]  If you have any questions prior to your surgery date call [phone]: Open Monday-Friday 8am-4pm If you experience any cold or flu symptoms such as cough, fever, chills, shortness of breath, etc. between now and your scheduled surgery, please notify us at the above number.     Remember:  Do not eat after midnight the night before your surgery  You may drink clear liquids until 4:30 A. M. the morning of your surgery.   Clear liquids allowed are: Water, Non-Citrus Juices (without pulp), Carbonated Beverages, Clear Tea, Black Coffee Only (NO MILK, CREAM OR POWDERED CREAMER of any kind), and Gatorade.   Patient Instructions  The night before surgery:  No food after midnight. ONLY clear liquids after midnight  The day of surgery (if you do NOT have diabetes):  Drink ONE (1) Pre-Surgery Clear Ensure by 4:30 a.m. the morning of surgery. Drink in one sitting. Do not sip.  This drink was given to you during your hospital  pre-op appointment visit.  Nothing else to drink after completing the  Pre-Surgery Clear Ensure.  The day of surgery (if you have diabetes): Drink ONE (1) 12 oz G2 given to you in your pre admission testing appointment by  the morning of surgery. Drink in one sitting. Do not sip.  This drink was given to you during your hospital  pre-op appointment visit.  Nothing else to drink after completing the  12 oz bottle of G2.         If you have questions, please contact your surgeon's office.     Take these medicines the morning of surgery with A SIP OF WATER   aspirin EC  Please check with cardiologist about when to hold medication prior to surgery  nadolol (CORGARD)   omeprazole (PRILOSEC)   IF NEEDED:  ALPRAZolam (XANAX)     As of today, STOP taking any Aspirin (unless otherwise instructed by your surgeon) Aleve, Naproxen, Ibuprofen, Motrin, Advil, Goody's, BC's, all herbal medications, fish oil, and all vitamins.   MOUNJARO Hold 7 days prior to procedure if taking weekly hold  24 hours prior to surgery if taking daily                 Do NOT Smoke (Tobacco/Vaping) for 24 hours prior to your procedure  If you use a CPAP at night, you may bring your mask/headgear for your overnight stay.   Contacts, glasses, piercing's, hearing aid's, dentures or partials may not be worn into surgery, please bring cases for these belongings.    For patients admitted to the hospital, discharge time will be determined by your treatment team.   Patients discharged the day of surgery will not be allowed to drive home, and someone needs to stay with them for 24 hours.  SURGICAL WAITING ROOM VISITATION Patients having surgery or a procedure may have no more than 2 support people in the waiting area - these visitors may rotate.   Children under the age of 3 must have an adult with them who is not the patient. If the patient needs to stay at the hospital during part of their recovery, the visitor guidelines for inpatient rooms apply. Pre-op nurse will coordinate an appropriate time for 1 support person to accompany  patient in pre-op.  This support person may not rotate.    Please refer to the [facility] website for the visitor guidelines for Inpatients (after your surgery is over and you are in a regular room).  Oral Hygiene is also important to reduce your risk of infection.  Remember - BRUSH YOUR TEETH THE MORNING OF SURGERY WITH YOUR REGULAR TOOTHPASTE  [facility]- Preparing for Total Shoulder Arthroplasty  Before surgery, you can play an important role. Because skin is not sterile, your skin needs to be as free of germs as possible. You can reduce the number of germs on your skin by using the following products.    Benzoyl Peroxide Gel  o Reduces the number of germs present on the skin  o Applied twice a day to shoulder area starting two days before surgery   Chlorhexidine Gluconate (CHG) Soap (instructions listed above on how to wash with CHG Soap)  o An antiseptic cleaner that kills germs and bonds with the skin to continue killing germs even after washing  o Used for showering the night before surgery and morning of surgery   ==================================================================  Please follow these instructions carefully:  BENZOYL PEROXIDE 5% GEL  Please do not use if you have an allergy to benzoyl peroxide. If your skin becomes reddened/irritated stop using the benzoyl peroxide.  Starting two days before surgery, apply as follows:  1. Apply benzoyl peroxide in the morning and at night. Apply after taking a shower. If you are not taking a shower clean entire shoulder front, back, and side along with the armpit with a clean wet washcloth.  2. Place a quarter-sized dollop on your SHOULDER and rub in thoroughly, making sure to cover the front, back, and side of your shoulder, along with the armpit.   2 Days prior to Surgery First Dose on _____________ Morning Second Dose on ______________ Night  Day Before Surgery First Dose on ______________ Morning Night before surgery wash (entire body except face and private areas) with CHG Soap THEN Second Dose on ____________ Night   Morning of Surgery  wash BODY AGAIN with CHG Soap   4. Do NOT apply benzoyl peroxide gel on the day of surgery. Surgical Instructions     Pre-operative 5 CHG Bathing Instructions   You can play a key role in reducing the risk of infection after surgery. Your skin needs to be as free of germs as possible. You can reduce the number of germs on your skin by washing with CHG (chlorhexidine gluconate) soap before surgery. CHG is an antiseptic soap that kills germs and continues to kill germs even  after washing.   DO NOT use if you have an allergy to chlorhexidine/CHG or antibacterial soaps. If your skin becomes reddened or irritated, stop using the CHG and notify one of our RNs at [phone].   Please shower with the CHG soap starting 4 days before surgery using the following schedule:     Please keep in mind the following:  DO NOT shave, including legs and underarms, starting the day of your first shower.   You may shave your face at any point before/day of surgery.  Place clean sheets on your bed the day you start using CHG soap. Use a clean washcloth (not used since being washed) for each shower. DO NOT sleep with pets once you start using the CHG.   CHG Shower Instructions:  If you choose to wash your hair and private area, wash first with your normal shampoo/soap.  After you use shampoo/soap, rinse your hair and body thoroughly to remove shampoo/soap residue.  Turn the water OFF and apply about 3 tablespoons (45 ml) of CHG soap to a CLEAN washcloth.  Apply CHG soap ONLY FROM YOUR NECK DOWN TO YOUR TOES (washing for 3-5 minutes)  DO NOT use CHG soap on face, private areas, open wounds, or sores.  Pay special attention to the area where your surgery is being performed.  If you are having back surgery, having someone wash your back for you may be helpful. Wait 2 minutes after CHG soap is applied, then you may rinse off the CHG soap.  Pat dry with a clean towel  Put on clean clothes/pajamas   If you choose to wear lotion, please use ONLY the CHG-compatible lotions on the back of this paper.   Additional instructions for the day of surgery: DO NOT APPLY any lotions, deodorants, cologne, or perfumes.   Do not bring valuables to the hospital. [facility] is not responsible for any belongings/valuables. Do not wear nail polish, gel polish, artificial nails, or any other type of covering on natural nails (fingers and toes) Do not wear jewelry or makeup Put on clean/comfortable  clothes.  Please brush your teeth.  Ask your nurse before applying any prescription medications to the skin.     CHG Compatible Lotions   Aveeno Moisturizing lotion  Cetaphil Moisturizing Cream  Cetaphil Moisturizing Lotion  Clairol Herbal Essence Moisturizing Lotion, Dry Skin  Clairol Herbal Essence Moisturizing Lotion, Extra Dry Skin  Clairol Herbal Essence Moisturizing Lotion, Normal Skin  Curel Age Defying Therapeutic Moisturizing Lotion with Alpha Hydroxy  Curel Extreme Care Body Lotion  Curel Soothing Hands Moisturizing Hand Lotion  Curel Therapeutic Moisturizing Cream, Fragrance-Free  Curel Therapeutic Moisturizing Lotion, Fragrance-Free  Curel Therapeutic Moisturizing Lotion, Original Formula  Eucerin Daily Replenishing Lotion  Eucerin Dry Skin Therapy Plus Alpha Hydroxy Crme  Eucerin Dry Skin Therapy Plus Alpha Hydroxy Lotion  Eucerin Original Crme  Eucerin Original Lotion  Eucerin Plus Crme Eucerin Plus Lotion  Eucerin TriLipid Replenishing Lotion  Keri Anti-Bacterial Hand Lotion  Keri Deep Conditioning Original Lotion Dry Skin Formula Softly Scented  Keri Deep Conditioning Original Lotion, Fragrance Free Sensitive Skin Formula  Keri Lotion Fast Absorbing Fragrance Free Sensitive Skin Formula  Keri Lotion Fast Absorbing Softly Scented Dry Skin Formula  Keri Original Lotion  Keri Skin Renewal Lotion Keri Silky Smooth Lotion  Keri Silky Smooth Sensitive Skin Lotion  Nivea Body Creamy Conditioning Oil  Nivea Body Extra Enriched Lotion  Nivea Body Original Lotion  Nivea Body Sheer Moisturizing Lotion Nivea Crme  Nivea Skin Firming Lotion  NutraDerm 30 Skin Lotion  NutraDerm Skin Lotion  NutraDerm Therapeutic Skin Cream  NutraDerm Therapeutic Skin Lotion  ProShield Protective Hand Cream  Provon moisturizing lotion  Please read over the following fact sheets that you were given.

## 2023-07-08 ENCOUNTER — Other Ambulatory Visit: Payer: Self-pay

## 2023-07-08 ENCOUNTER — Encounter (HOSPITAL_COMMUNITY)
Admission: RE | Admit: 2023-07-08 | Discharge: 2023-07-08 | Disposition: A | Discharge status: Home / Self Care | Payer: Medicare Other | Source: Ambulatory Visit | Attending: Orthopedic Surgery | Admitting: Orthopedic Surgery

## 2023-07-08 ENCOUNTER — Encounter (HOSPITAL_COMMUNITY): Payer: Self-pay

## 2023-07-08 VITALS — BP 151/77 | HR 75 | Temp 97.8°F | Resp 18 | Ht 64.0 in | Wt 238.0 lb

## 2023-07-08 DIAGNOSIS — Z01812 Encounter for preprocedural laboratory examination: Secondary | ICD-10-CM | POA: Insufficient documentation

## 2023-07-08 DIAGNOSIS — Z01818 Encounter for other preprocedural examination: Secondary | ICD-10-CM | POA: Diagnosis present

## 2023-07-08 HISTORY — DX: Chronic obstructive pulmonary disease, unspecified: J44.9

## 2023-07-08 HISTORY — DX: Ventricular premature depolarization: I49.3

## 2023-07-08 HISTORY — DX: Malignant (primary) neoplasm, unspecified: C80.1

## 2023-07-08 HISTORY — DX: Gastro-esophageal reflux disease without esophagitis: K21.9

## 2023-07-08 LAB — URINALYSIS, W/ REFLEX TO CULTURE (INFECTION SUSPECTED)
Bacteria, UA: NONE SEEN
Bilirubin Urine: NEGATIVE
Glucose, UA: NEGATIVE mg/dL
Hgb urine dipstick: NEGATIVE
Ketones, ur: NEGATIVE mg/dL
Leukocytes,Ua: NEGATIVE
Nitrite: NEGATIVE
Protein, ur: NEGATIVE mg/dL
Specific Gravity, Urine: 1.004 — ABNORMAL LOW (ref 1.005–1.030)
pH: 6 (ref 5.0–8.0)

## 2023-07-08 LAB — TYPE AND SCREEN
ABO/RH(D): O POS
Antibody Screen: NEGATIVE

## 2023-07-08 LAB — GLUCOSE, CAPILLARY: Glucose-Capillary: 96 mg/dL (ref 70–99)

## 2023-07-08 LAB — SURGICAL PCR SCREEN
MRSA, PCR: NEGATIVE
Staphylococcus aureus: NEGATIVE

## 2023-07-08 NOTE — Progress Notes (Signed)
PCP - Dr. Lucinda Dell Cardiologist -  Tiffiany Pluck  PPM/ICD - denies Device Orders - n/a Rep Notified - n/a  Chest x-ray - 06-17-23 EKG - 03-19-23 Stress Test - requesting from cardiology ECHO - requesting from cardiology Cardiac Cath - cardiology  Sleep Study - yes CPAP - Does not tolerated mask  Fasting Blood Sugar - between 120-130 Checks Blood Sugar daily  Blood sugar at PAT appointment 96  Last dose of GLP1 agonist-  Mounjaro GLP1 instructions:  Last dose 07-05-23 Takes on fridays  Blood Thinner Instructions: n/a Aspirin Instructions:yes ask patient to call cardiology for further instructions  ERAS Protcol - yes with clear drink PRE-SURGERY  G2-   COVID TEST- n/a   Anesthesia review: yes cardiac history  Patient denies shortness of breath, fever, cough and chest pain at PAT appointment   All instructions explained to the patient, with a verbal understanding of the material. Patient agrees to go over the instructions while at home for a better understanding. Patient also instructed to self quarantine after being tested for COVID-19. The opportunity to ask questions was provided.

## 2023-07-09 NOTE — Progress Notes (Addendum)
Anesthesia Review:  PCP: Janeece Fitting LOV 06/05/23  Cardiologist : Rodena Piety , PA LOV 05/20/23.  Chest x-ray : EKG : 03/19/23  Echo : Stress test: Cardiac Cath :  Activity level: can do a flight of stairs without difficulty  Sleep Study/ CPAP : Fasting Blood Sugar :      / Checks Blood Sugar -- times a day:   Blood Thinner/ Instructions /Last Dose: ASA / Instructions/ Last Dose :  Asa 81 mg- call Tiffany Plunk in regards to Aspirin - pt to call   DM- type 2 06/17/23-hgba1c- 5.5  Mounjaro- Last dose on 07/05/2023     ORiginal preop done on 07/08/23- labs doen of pcr, u/a, type and scrren  06/17/23- CMP done on 06/17/23. And CBC done.    LVMM x 2 on home phone and cell phone on 07/09/2023 to call [facility] .     1420pm- Called and LVMM again after pt called and LVMM.     Spoke with pt on 07/09/23 and reviewed date and time at [place] [facility] on 07/18/23.  PT has preop insturctions from preop appt on 07/08/23.

## 2023-07-10 ENCOUNTER — Encounter (HOSPITAL_COMMUNITY): Payer: Self-pay

## 2023-07-10 NOTE — Progress Notes (Signed)
Case: [id number] Date/Time: 07/18/23 0715   Procedures:      RIGHT REVERSE SHOULDER ARTHROPLASTY (Right: Shoulder)     BICEPS TENODESIS (Right: Shoulder)   Anesthesia type: General   Pre-op diagnosis: right rotator cuff arthropathy, biceps tendinitis   Location: [facility] RO[facility] 09 / [facility]   Surgeons: Cammy Copa, MD       DISCUSSION: Lindsey Williams is a 77 year old female who is being evaluated prior to surgery above.  Past medical history significant for diabetes, HTN, HLD, PVCs, GERD, COPD, history of bladder cancer.  Prior anesthesia complications include PONV  Patient follows with cardiology for frequent PVCs and palpitations.  Last seen on 05/20/2023.  She was switched from nadolol to metoprolol.  Advised to follow-up in 4 months.  Follows with her PCP for other chronic medical issues.  Had a bronchitis diagnosed on 06/05/2023 and treated with Augmentin and steroids.  Patient denies symptoms the PAT RN and reports good functional status.   VS: BP (!) 151/77   Pulse 75   Temp 36.6 C   Resp 18   Ht 5\' 4"  (1.626 m)   Wt 108 kg   SpO2 99%   BMI 40.85 kg/m   PROVIDERS: Valla Leaver, MD Cardiology: Rodena Piety, NP  LABS: Labs reviewed: Acceptable for surgery. (all labs ordered are listed, but only abnormal results are displayed)  Labs Reviewed  URINALYSIS, W/ REFLEX TO CULTURE (INFECTION SUSPECTED) - Abnormal; Notable for the following components:      Result Value   Color, Urine COLORLESS (*)    Specific Gravity, Urine 1.004 (*)    All other components within normal limits  SURGICAL PCR SCREEN  GLUCOSE, CAPILLARY  TYPE AND SCREEN     IMAGES:  MRI R shoulder 03/06/23:  IMPRESSION: 1. Complete tear of the supraspinatus tendon with 2.9 cm of retraction. 2. Moderate tendinosis of the infraspinatus tendon with a full-thickness tear of the anterior aspect of the infraspinatus tendon. 3. Mild-moderate osteoarthritis of the glenohumeral  joint.   EKG:   CV:  Echo 09/05/2021:  Summary   1. Overall left ventricular ejection fraction is estimated at 50 to 55%.   2. Low normal global left ventricular systolic function.   3. Normal right ventricular size and systolic function.   4. There is no evidence of pericardial effusion.   5. Mild mitral annular calcification.   6. Mild mitral valve regurgitation.   Holter monitor 01/30/2020:  Indication: Palpitations  Start date: 01/25/2020  Recording time: 46 hours 50 minutes  Analyzed time: 44 hours 57 minutes  Total beat count: [id number]  Maximum heart rate: 117 bpm at 116 hours  Minimum heart rate: 63 bpm at 1012 hrs.  Average heart rate: 80 bpm  Atrial fibrillation: None found  SVT: None found  Heart block: None found  Pauses > 3 seconds: none found  VPC's: Total 2633, averaging 58.6/h, 20 473 singles, 8 impaired, 28  trigeminy 25 beats in bigeminy  3 beat run of VT at 140 bpm at 1808 hrs. Longest run was 5 beats and  occurred at 2253 hrs. at a rate of 102 bpm  SVE's: Total 2, singles  Predominantly sinus rhythm.  Normal AV conduction.    Past Medical History:  Diagnosis Date   Cancer (HCC)    bladder ca in 1989   Chronic kidney disease    BLADDER CANCER - FOLLOWED YEARLY   COPD (chronic obstructive pulmonary disease) (HCC)    Diabetes mellitus    "PRE DIABETIC"  DIET CONTROLLED   Dysrhythmia    UNK ARRYTHMIA    GERD (gastroesophageal reflux disease)    Hypertension    Nausea & vomiting    No pertinent past medical history CONSTIPATION AND EPIGASTRIC PAIN   EPIGASTRIC PAIN   Osteoarthritis of left knee 10/07/2011   Pneumonia    HX PE 1972 SUPERFICIAL PHLEBITIS 2010    Past Surgical History:  Procedure Laterality Date   ABDOMINAL HYSTERECTOMY     1989   ARCUATE KERATECTOMY     BIOPSY  03/21/2023   Procedure: BIOPSY;  Surgeon: Lanelle Bal, DO;  [facility];  Service: Gastroenterology;;   BREAST BIOPSY     BIL   CARDIAC  CATHETERIZATION     8 YRS AGO/ STRESS ECHO 8 YRS AGO   CARPAL TUNNEL RELEASE     BILATERAL   CHOLECYSTECTOMY     COLONOSCOPY WITH PROPOFOL N/A 03/21/2023   Procedure: COLONOSCOPY WITH PROPOFOL;  Surgeon: Lanelle Bal, DO;  [facility];  Service: Gastroenterology;  Laterality: N/A;  1:15pm; ASA 3   CYSTOSCOPY     YEARLY   ESOPHAGOGASTRODUODENOSCOPY (EGD) WITH PROPOFOL N/A 03/21/2023   Procedure: ESOPHAGOGASTRODUODENOSCOPY (EGD) WITH PROPOFOL;  Surgeon: Lanelle Bal, DO;  [facility];  Service: Gastroenterology;  Laterality: N/A;  1:15 pm;ASA 3   EYE SURGERY     HEMORROIDECTOMY     INGUINAL HERNIA REPAIR     JOINT REPLACEMENT     RT TOTAL KNEE 2011   KNEE ARTHROPLASTY  10/05/2011   Procedure: COMPUTER ASSISTED TOTAL KNEE ARTHROPLASTY;  Surgeon: Kathryne Hitch;  Location: [facility];  Service: Orthopedics;  Laterality: Left;  preop femoral nerve block   NONE  09/26/2011   NO PREVIOUS SURG   NONE     POLYPECTOMY  03/21/2023   Procedure: POLYPECTOMY;  Surgeon: Lanelle Bal, DO;  [facility];  Service: Gastroenterology;;   ULNAR NERVE REPAIR     BIL   WRIST ARTHROSCOPY     BIL    MEDICATIONS:  acetaminophen-codeine (TYLENOL #3) 300-30 MG tablet   albuterol (VENTOLIN HFA) 108 (90 Base) MCG/ACT inhaler   ALPRAZolam (XANAX) 0.5 MG tablet   aspirin EC 81 MG tablet   BREYNA 160-4.5 MCG/ACT inhaler   calcium-vitamin D (OSCAL WITH D) 500-200 MG-UNIT per tablet   Cholecalciferol (D-3-5) 5000 units capsule   furosemide (LASIX) 20 MG tablet   furosemide (LASIX) 40 MG tablet   metoprolol tartrate (LOPRESSOR) 50 MG tablet   MOUNJARO 2.5 MG/0.5ML Pen   MOUNJARO 5 MG/0.5ML Pen   nadolol (CORGARD) 40 MG tablet   omeprazole (PRILOSEC) 40 MG capsule   potassium chloride SA (KLOR-CON M) 20 MEQ tablet   No current facility-administered medications for this encounter.   [name] [facility] Surgical Short Stay/Anesthesiology [facility]  Phone [phone] 07/10/2023 9:35 AM

## 2023-07-10 NOTE — Anesthesia Preprocedure Evaluation (Addendum)
Anesthesia Evaluation  Patient identified by MRN, date of birth, ID band Patient awake    Reviewed: Allergy & Precautions, H&P , NPO status , Patient's Chart, lab work & pertinent test results, reviewed documented beta blocker date and time   Airway Mallampati: II  TM Distance: >3 FB Neck ROM: Full    Dental  (+) Caps, Missing, Dental Advisory Given   Pulmonary pneumonia, COPD   Pulmonary exam normal        Cardiovascular Exercise Tolerance: Good hypertension, Pt. on medications and Pt. on home beta blockers + dysrhythmias + Valvular Problems/Murmurs  Rhythm:Regular Rate:Normal + Systolic murmurs ECHO 09/05/2021: 1. Overall left ventricular ejection fraction is estimated at 50 to 55%. 2. Low normal global left ventricular systolic function. 3. Normal right ventricular size and systolic function. 4. There is no evidence of pericardial effusion. 5. Mild mitral annular calcification. 6. Mild mitral valve regurgitation.     Neuro/Psych negative neurological ROS  negative psych ROS   GI/Hepatic Neg liver ROS,GERD  Medicated, Controlled and Poorly Controlled,,  Endo/Other  diabetes, Well Controlled, Type 2, Oral Hypoglycemic Agents  Morbid obesity  Renal/GU Renal disease  negative genitourinary   Musculoskeletal  (+) Arthritis , Osteoarthritis,    Abdominal  (+) + obese  Peds negative pediatric ROS (+)  Hematology negative hematology ROS (+)   Anesthesia Other Findings History of bladder cancer  Hypertension  Hypertensive heart disease  Long term prescription benzodiazepine use  Venous insufficiency (chronic) (peripheral)  Sigmoid diverticulosis  Gastroesophageal reflux disease without esophagitis  S/P endoscopy  S/P skin and subcutaneous tissue surgery  Controlled type 2 diabetes mellitus without complication, without long-term current use of insulin (HCC)  History of deep venous thrombosis (DVT) of distal vein  of left lower extremity  History of COVID-19  OSA (obstructive sleep apnea)  DOE (dyspnea on exertion)  Heart palpitations  PVC's (premature ventricular contractions)  ARB intolerance  Hypoxia  Type 2 diabetes mellitus with other circulatory complications (HCC)  History of total right knee replacement  Osteoarthritis of left knee  Costochondritis  Non-rheumatic mitral regurgitation  Need for shingles vaccine  Leg swelling    Reproductive/Obstetrics negative OB ROS                             Anesthesia Physical Anesthesia Plan  ASA: 3  Anesthesia Plan: General   Post-op Pain Management: Minimal or no pain anticipated and Regional block*   Induction: Intravenous  PONV Risk Score and Plan: 3 and Ondansetron, Dexamethasone, Midazolam and Treatment may vary due to age or medical condition  Airway Management Planned: Oral ETT  Additional Equipment:   Intra-op Plan:   Post-operative Plan: Extubation in OR  Informed Consent: I have reviewed the patients History and Physical, chart, labs and discussed the procedure including the risks, benefits and alternatives for the proposed anesthesia with the patient or authorized representative who has indicated his/her understanding and acceptance.     Dental advisory given  Plan Discussed with: CRNA and Surgeon  Anesthesia Plan Comments: (See PAT note from 8/12 by K Gekas PA-C )       Anesthesia Quick Evaluation

## 2023-07-18 ENCOUNTER — Other Ambulatory Visit: Payer: Self-pay

## 2023-07-18 ENCOUNTER — Ambulatory Visit (HOSPITAL_COMMUNITY): Payer: Medicare Other

## 2023-07-18 ENCOUNTER — Encounter (HOSPITAL_COMMUNITY)
Admission: RE | Disposition: A | Discharge status: Home / Self Care | Payer: Self-pay | Source: Home / Self Care | Attending: Orthopedic Surgery

## 2023-07-18 ENCOUNTER — Ambulatory Visit (HOSPITAL_BASED_OUTPATIENT_CLINIC_OR_DEPARTMENT_OTHER): Payer: Medicare Other | Admitting: Anesthesiology

## 2023-07-18 ENCOUNTER — Observation Stay (HOSPITAL_COMMUNITY)
Admission: RE | Admit: 2023-07-18 | Discharge: 2023-07-19 | Disposition: A | Discharge status: Home / Self Care | Payer: Medicare Other | Attending: Orthopedic Surgery | Admitting: Orthopedic Surgery

## 2023-07-18 ENCOUNTER — Encounter (HOSPITAL_COMMUNITY): Payer: Self-pay | Admitting: Orthopedic Surgery

## 2023-07-18 ENCOUNTER — Ambulatory Visit (HOSPITAL_COMMUNITY): Payer: Medicare Other | Admitting: Physician Assistant

## 2023-07-18 DIAGNOSIS — Z8551 Personal history of malignant neoplasm of bladder: Secondary | ICD-10-CM | POA: Diagnosis not present

## 2023-07-18 DIAGNOSIS — Z7982 Long term (current) use of aspirin: Secondary | ICD-10-CM | POA: Insufficient documentation

## 2023-07-18 DIAGNOSIS — Z86711 Personal history of pulmonary embolism: Secondary | ICD-10-CM | POA: Insufficient documentation

## 2023-07-18 DIAGNOSIS — Z79899 Other long term (current) drug therapy: Secondary | ICD-10-CM | POA: Diagnosis not present

## 2023-07-18 DIAGNOSIS — I10 Essential (primary) hypertension: Secondary | ICD-10-CM | POA: Diagnosis not present

## 2023-07-18 DIAGNOSIS — I499 Cardiac arrhythmia, unspecified: Secondary | ICD-10-CM | POA: Diagnosis not present

## 2023-07-18 DIAGNOSIS — Z96653 Presence of artificial knee joint, bilateral: Secondary | ICD-10-CM | POA: Insufficient documentation

## 2023-07-18 DIAGNOSIS — J449 Chronic obstructive pulmonary disease, unspecified: Secondary | ICD-10-CM | POA: Diagnosis not present

## 2023-07-18 DIAGNOSIS — M19011 Primary osteoarthritis, right shoulder: Secondary | ICD-10-CM

## 2023-07-18 DIAGNOSIS — E119 Type 2 diabetes mellitus without complications: Secondary | ICD-10-CM | POA: Insufficient documentation

## 2023-07-18 DIAGNOSIS — Z96611 Presence of right artificial shoulder joint: Secondary | ICD-10-CM

## 2023-07-18 DIAGNOSIS — M7521 Bicipital tendinitis, right shoulder: Principal | ICD-10-CM | POA: Insufficient documentation

## 2023-07-18 DIAGNOSIS — Z01818 Encounter for other preprocedural examination: Principal | ICD-10-CM

## 2023-07-18 HISTORY — PX: BICEPT TENODESIS: SHX5116

## 2023-07-18 HISTORY — PX: REVERSE SHOULDER ARTHROPLASTY: SHX5054

## 2023-07-18 LAB — GLUCOSE, CAPILLARY
Glucose-Capillary: 103 mg/dL — ABNORMAL HIGH (ref 70–99)
Glucose-Capillary: 172 mg/dL — ABNORMAL HIGH (ref 70–99)

## 2023-07-18 LAB — CBC
HCT: 38.4 % (ref 36.0–46.0)
Hemoglobin: 11.8 g/dL — ABNORMAL LOW (ref 12.0–15.0)
MCH: 25.9 pg — ABNORMAL LOW (ref 26.0–34.0)
MCHC: 30.7 g/dL (ref 30.0–36.0)
MCV: 84.2 fL (ref 80.0–100.0)
Platelets: 373 10*3/uL (ref 150–400)
RBC: 4.56 MIL/uL (ref 3.87–5.11)
RDW: 15.8 % — ABNORMAL HIGH (ref 11.5–15.5)
WBC: 6.4 10*3/uL (ref 4.0–10.5)
nRBC: 0 % (ref 0.0–0.2)

## 2023-07-18 LAB — BASIC METABOLIC PANEL
Anion gap: 11 (ref 5–15)
BUN: 15 mg/dL (ref 8–23)
CO2: 21 mmol/L — ABNORMAL LOW (ref 22–32)
Calcium: 9.1 mg/dL (ref 8.9–10.3)
Chloride: 107 mmol/L (ref 98–111)
Creatinine, Ser: 0.96 mg/dL (ref 0.44–1.00)
GFR, Estimated: >60 mL/min (ref >60)
Glucose, Bld: 111 mg/dL — ABNORMAL HIGH (ref 70–99)
Potassium: 4 mmol/L (ref 3.5–5.1)
Sodium: 139 mmol/L (ref 135–145)

## 2023-07-18 SURGERY — ARTHROPLASTY, SHOULDER, TOTAL, REVERSE
Anesthesia: General | Site: Shoulder | Laterality: Right

## 2023-07-18 MED ORDER — METOCLOPRAMIDE HCL 5 MG PO TABS
5.0000 mg | ORAL_TABLET | Freq: Three times a day (TID) | ORAL | Status: DC | PRN
  Administered 2023-07-19: 10 mg via ORAL
  Filled 2023-07-18: qty 2

## 2023-07-18 MED ORDER — MIDAZOLAM HCL 2 MG/2ML IJ SOLN
INTRAMUSCULAR | Status: AC
  Filled 2023-07-18: qty 2

## 2023-07-18 MED ORDER — OYSTER SHELL CALCIUM/D3 500-5 MG-MCG PO TABS
0.5000 | ORAL_TABLET | Freq: Every day | ORAL | Status: DC
  Administered 2023-07-18: 0.5 via ORAL
  Filled 2023-07-18 (×2): qty 0.5
  Filled 2023-07-18: qty 1

## 2023-07-18 MED ORDER — ACETAMINOPHEN 500 MG PO TABS
1000.0000 mg | ORAL_TABLET | Freq: Four times a day (QID) | ORAL | Status: AC
  Administered 2023-07-18 – 2023-07-19 (×4): 1000 mg via ORAL
  Filled 2023-07-18 (×4): qty 2

## 2023-07-18 MED ORDER — BUPIVACAINE HCL (PF) 0.5 % IJ SOLN
INTRAMUSCULAR | Status: DC | PRN
  Administered 2023-07-18: 20 mL via PERINEURAL

## 2023-07-18 MED ORDER — 0.9 % SODIUM CHLORIDE (POUR BTL) OPTIME
TOPICAL | Status: DC | PRN
  Administered 2023-07-18: 1000 mL

## 2023-07-18 MED ORDER — ORAL CARE MOUTH RINSE: 15.0000 mL | Freq: Once | OROMUCOSAL | Status: AC

## 2023-07-18 MED ORDER — POVIDONE-IODINE 10 % EX SWAB: 2.0000 | Freq: Once | CUTANEOUS | Status: DC

## 2023-07-18 MED ORDER — METOPROLOL TARTRATE 50 MG PO TABS
100.0000 mg | ORAL_TABLET | Freq: Two times a day (BID) | ORAL | Status: DC
  Administered 2023-07-18 – 2023-07-19 (×2): 100 mg via ORAL
  Filled 2023-07-18 (×2): qty 2

## 2023-07-18 MED ORDER — FENTANYL CITRATE (PF) 100 MCG/2ML IJ SOLN
INTRAMUSCULAR | Status: AC
  Filled 2023-07-18: qty 2

## 2023-07-18 MED ORDER — OXYCODONE HCL 5 MG PO TABS
5.0000 mg | ORAL_TABLET | ORAL | Status: DC | PRN
  Administered 2023-07-18: 5 mg via ORAL
  Filled 2023-07-18: qty 1

## 2023-07-18 MED ORDER — LIDOCAINE HCL (PF) 2 % IJ SOLN
INTRAMUSCULAR | Status: AC
  Filled 2023-07-18: qty 5

## 2023-07-18 MED ORDER — SCOPOLAMINE 1 MG/3DAYS TD PT72
MEDICATED_PATCH | TRANSDERMAL | Status: DC | PRN
  Administered 2023-07-18: 1 via TRANSDERMAL

## 2023-07-18 MED ORDER — POVIDONE-IODINE 7.5 % EX SOLN: Freq: Once | CUTANEOUS | Status: DC

## 2023-07-18 MED ORDER — METHOCARBAMOL 500 MG IVPB - SIMPLE MED
500.0000 mg | Freq: Four times a day (QID) | INTRAVENOUS | Status: DC | PRN
  Administered 2023-07-18: 500 mg via INTRAVENOUS

## 2023-07-18 MED ORDER — CEFAZOLIN SODIUM-DEXTROSE 2-4 GM/100ML-% IV SOLN
2.0000 g | INTRAVENOUS | Status: AC
  Administered 2023-07-18: 2 g via INTRAVENOUS
  Filled 2023-07-18: qty 100

## 2023-07-18 MED ORDER — BUPIVACAINE LIPOSOME 1.3 % IJ SUSP
INTRAMUSCULAR | Status: DC | PRN
Start: 2023-07-18 — End: 2023-07-18
  Administered 2023-07-18: 10 mL via PERINEURAL

## 2023-07-18 MED ORDER — SCOPOLAMINE 1 MG/3DAYS TD PT72
MEDICATED_PATCH | TRANSDERMAL | Status: AC
  Filled 2023-07-18: qty 1

## 2023-07-18 MED ORDER — PROPOFOL 10 MG/ML IV BOLUS
INTRAVENOUS | Status: AC
  Filled 2023-07-18: qty 20

## 2023-07-18 MED ORDER — PHENYLEPHRINE 80 MCG/ML (10ML) SYRINGE FOR IV PUSH (FOR BLOOD PRESSURE SUPPORT)
PREFILLED_SYRINGE | INTRAVENOUS | Status: AC
  Filled 2023-07-18: qty 10

## 2023-07-18 MED ORDER — LIDOCAINE 2% (20 MG/ML) 5 ML SYRINGE
INTRAMUSCULAR | Status: DC | PRN
  Administered 2023-07-18: 60 mg via INTRAVENOUS

## 2023-07-18 MED ORDER — ONDANSETRON HCL 4 MG/2ML IJ SOLN
4.0000 mg | Freq: Four times a day (QID) | INTRAMUSCULAR | Status: DC | PRN
  Administered 2023-07-18 – 2023-07-19 (×2): 4 mg via INTRAVENOUS
  Filled 2023-07-18 (×2): qty 2

## 2023-07-18 MED ORDER — DOCUSATE SODIUM 100 MG PO CAPS
100.0000 mg | ORAL_CAPSULE | Freq: Two times a day (BID) | ORAL | Status: DC
  Administered 2023-07-18 – 2023-07-19 (×2): 100 mg via ORAL
  Filled 2023-07-18 (×2): qty 1

## 2023-07-18 MED ORDER — LACTATED RINGERS IV SOLN: INTRAVENOUS | Status: DC

## 2023-07-18 MED ORDER — SUCCINYLCHOLINE CHLORIDE 200 MG/10ML IV SOSY
PREFILLED_SYRINGE | INTRAVENOUS | Status: DC | PRN
  Administered 2023-07-18: 100 mg via INTRAVENOUS

## 2023-07-18 MED ORDER — CEFAZOLIN SODIUM-DEXTROSE 2-4 GM/100ML-% IV SOLN
2.0000 g | Freq: Three times a day (TID) | INTRAVENOUS | Status: AC
  Administered 2023-07-18 – 2023-07-19 (×3): 2 g via INTRAVENOUS
  Filled 2023-07-18 (×3): qty 100

## 2023-07-18 MED ORDER — ONDANSETRON HCL 4 MG PO TABS: 4.0000 mg | ORAL_TABLET | Freq: Four times a day (QID) | ORAL | Status: DC | PRN

## 2023-07-18 MED ORDER — SODIUM CHLORIDE 0.9 % IV SOLN: INTRAVENOUS | Status: DC

## 2023-07-18 MED ORDER — CHLORHEXIDINE GLUCONATE 0.12 % MT SOLN
15.0000 mL | Freq: Once | OROMUCOSAL | Status: AC
  Administered 2023-07-18: 15 mL via OROMUCOSAL

## 2023-07-18 MED ORDER — ONDANSETRON HCL 4 MG/2ML IJ SOLN
INTRAMUSCULAR | Status: DC | PRN
  Administered 2023-07-18: 4 mg via INTRAVENOUS

## 2023-07-18 MED ORDER — ACETAMINOPHEN 325 MG PO TABS: 325.0000 mg | ORAL_TABLET | Freq: Four times a day (QID) | ORAL | Status: DC | PRN

## 2023-07-18 MED ORDER — METHOCARBAMOL 500 MG PO TABS: 500.0000 mg | ORAL_TABLET | Freq: Four times a day (QID) | ORAL | Status: DC | PRN

## 2023-07-18 MED ORDER — AMISULPRIDE (ANTIEMETIC) 5 MG/2ML IV SOLN: 10.0000 mg | Freq: Once | INTRAVENOUS | Status: DC | PRN

## 2023-07-18 MED ORDER — OXYCODONE HCL 5 MG/5ML PO SOLN: 5.0000 mg | Freq: Once | ORAL | Status: DC | PRN

## 2023-07-18 MED ORDER — ALBUTEROL SULFATE (2.5 MG/3ML) 0.083% IN NEBU: 2.5000 mg | INHALATION_SOLUTION | Freq: Four times a day (QID) | RESPIRATORY_TRACT | Status: DC | PRN

## 2023-07-18 MED ORDER — PANTOPRAZOLE SODIUM 40 MG PO TBEC
40.0000 mg | DELAYED_RELEASE_TABLET | Freq: Every day | ORAL | Status: DC
  Administered 2023-07-18 – 2023-07-19 (×2): 40 mg via ORAL
  Filled 2023-07-18 (×2): qty 1

## 2023-07-18 MED ORDER — POTASSIUM CHLORIDE CRYS ER 20 MEQ PO TBCR
20.0000 meq | EXTENDED_RELEASE_TABLET | Freq: Every day | ORAL | Status: DC
  Administered 2023-07-18 – 2023-07-19 (×2): 20 meq via ORAL
  Filled 2023-07-18 (×2): qty 1

## 2023-07-18 MED ORDER — DEXAMETHASONE SODIUM PHOSPHATE 10 MG/ML IJ SOLN
INTRAMUSCULAR | Status: AC
  Filled 2023-07-18: qty 1

## 2023-07-18 MED ORDER — TRANEXAMIC ACID-NACL 1000-0.7 MG/100ML-% IV SOLN
1000.0000 mg | INTRAVENOUS | Status: AC
  Administered 2023-07-18: 1000 mg via INTRAVENOUS
  Filled 2023-07-18: qty 100

## 2023-07-18 MED ORDER — METHOCARBAMOL 500 MG IVPB - SIMPLE MED
INTRAVENOUS | Status: AC
  Filled 2023-07-18: qty 55

## 2023-07-18 MED ORDER — DEXAMETHASONE SODIUM PHOSPHATE 4 MG/ML IJ SOLN
INTRAMUSCULAR | Status: DC | PRN
  Administered 2023-07-18: 5 mg via INTRAVENOUS

## 2023-07-18 MED ORDER — PHENOL 1.4 % MT LIQD: 1.0000 | OROMUCOSAL | Status: DC | PRN

## 2023-07-18 MED ORDER — HYDROMORPHONE HCL 1 MG/ML IJ SOLN: 0.5000 mg | INTRAMUSCULAR | Status: DC | PRN

## 2023-07-18 MED ORDER — IRRISEPT - 450ML BOTTLE WITH 0.05% CHG IN STERILE WATER, USP 99.95% OPTIME
TOPICAL | Status: DC | PRN
  Administered 2023-07-18: 450 mL via TOPICAL

## 2023-07-18 MED ORDER — PHENYLEPHRINE 80 MCG/ML (10ML) SYRINGE FOR IV PUSH (FOR BLOOD PRESSURE SUPPORT)
PREFILLED_SYRINGE | INTRAVENOUS | Status: DC | PRN
  Administered 2023-07-18: 160 ug via INTRAVENOUS
  Administered 2023-07-18: 120 ug via INTRAVENOUS

## 2023-07-18 MED ORDER — PHENYLEPHRINE HCL-NACL 20-0.9 MG/250ML-% IV SOLN
INTRAVENOUS | Status: DC | PRN
  Administered 2023-07-18: 30 ug/min via INTRAVENOUS

## 2023-07-18 MED ORDER — MIDAZOLAM HCL 2 MG/2ML IJ SOLN
INTRAMUSCULAR | Status: DC | PRN
  Administered 2023-07-18: 2 mg via INTRAVENOUS

## 2023-07-18 MED ORDER — VANCOMYCIN HCL 1000 MG IV SOLR
INTRAVENOUS | Status: DC | PRN
  Administered 2023-07-18: 1000 mg

## 2023-07-18 MED ORDER — STERILE WATER FOR IRRIGATION IR SOLN
Status: DC | PRN
  Administered 2023-07-18: 1000 mL

## 2023-07-18 MED ORDER — FENTANYL CITRATE (PF) 100 MCG/2ML IJ SOLN
INTRAMUSCULAR | Status: DC | PRN
  Administered 2023-07-18 (×2): 50 ug via INTRAVENOUS

## 2023-07-18 MED ORDER — ROCURONIUM BROMIDE 10 MG/ML (PF) SYRINGE
PREFILLED_SYRINGE | INTRAVENOUS | Status: AC
  Filled 2023-07-18: qty 10

## 2023-07-18 MED ORDER — PROPOFOL 10 MG/ML IV BOLUS
INTRAVENOUS | Status: DC | PRN
Start: 2023-07-18 — End: 2023-07-18
  Administered 2023-07-18: 150 mg via INTRAVENOUS

## 2023-07-18 MED ORDER — METOCLOPRAMIDE HCL 5 MG/ML IJ SOLN
5.0000 mg | Freq: Three times a day (TID) | INTRAMUSCULAR | Status: DC | PRN
  Administered 2023-07-18: 10 mg via INTRAVENOUS
  Filled 2023-07-18: qty 2

## 2023-07-18 MED ORDER — ASPIRIN 81 MG PO TBEC
81.0000 mg | DELAYED_RELEASE_TABLET | Freq: Every day | ORAL | Status: DC
  Administered 2023-07-18 – 2023-07-19 (×2): 81 mg via ORAL
  Filled 2023-07-18 (×2): qty 1

## 2023-07-18 MED ORDER — ONDANSETRON HCL 4 MG/2ML IJ SOLN
INTRAMUSCULAR | Status: AC
  Filled 2023-07-18: qty 2

## 2023-07-18 MED ORDER — PROMETHAZINE HCL 25 MG/ML IJ SOLN: 6.2500 mg | INTRAMUSCULAR | Status: DC | PRN

## 2023-07-18 MED ORDER — MENTHOL 3 MG MT LOZG: 1.0000 | LOZENGE | OROMUCOSAL | Status: DC | PRN

## 2023-07-18 MED ORDER — FUROSEMIDE 40 MG PO TABS
40.0000 mg | ORAL_TABLET | Freq: Every day | ORAL | Status: DC
  Administered 2023-07-18 – 2023-07-19 (×2): 40 mg via ORAL
  Filled 2023-07-18 (×2): qty 1

## 2023-07-18 MED ORDER — VANCOMYCIN HCL 1000 MG IV SOLR
INTRAVENOUS | Status: AC
  Filled 2023-07-18: qty 20

## 2023-07-18 MED ORDER — OXYCODONE HCL 5 MG PO TABS: 5.0000 mg | ORAL_TABLET | Freq: Once | ORAL | Status: DC | PRN

## 2023-07-18 MED ORDER — HYDROMORPHONE HCL 1 MG/ML IJ SOLN: 0.2500 mg | INTRAMUSCULAR | Status: DC | PRN

## 2023-07-18 SURGICAL SUPPLY — 80 items
AID PSTN UNV HD RSTRNT DISP (MISCELLANEOUS) ×1
ALCOHOL 70% 16 OZ (MISCELLANEOUS) ×1 IMPLANT
APL PRP STRL LF DISP 70% ISPRP (MISCELLANEOUS) ×2
AUG COMP REV MI TAPER ADAPTER (Joint) ×1 IMPLANT
AUGMENT COMP REV MI TAPR ADPTR (Joint) IMPLANT
BAG COUNTER SPONGE SURGICOUNT (BAG) ×1 IMPLANT
BAG SPNG CNTER NS LX DISP (BAG) ×1
BEARING HUMERAL SHLDER 36M STD (Shoulder) IMPLANT
BIT DRILL 1.6MX128 (BIT) IMPLANT
BIT DRILL 2.7 W/STOP DISP (BIT) IMPLANT
BIT DRILL QUICK REL 1/8 2PK SL (BIT) IMPLANT
BIT DRILL TWIST 2.7 (BIT) IMPLANT
BLADE SAW SGTL 13X75X1.27 (BLADE) ×1 IMPLANT
BRNG HUM STD 36 RVRS SHLDR (Shoulder) ×1 IMPLANT
BSPLAT GLND SM AUG TPR ADPR (Joint) ×1 IMPLANT
CHLORAPREP W/TINT 26 (MISCELLANEOUS) ×2 IMPLANT
COOLER ICEMAN CLASSIC (MISCELLANEOUS) ×1 IMPLANT
COVER SURGICAL LIGHT HANDLE (MISCELLANEOUS) ×1 IMPLANT
DRAIN PENROSE 0.5X18 (DRAIN) IMPLANT
DRAPE INCISE IOBAN 66X45 STRL (DRAPES) ×1 IMPLANT
DRAPE ORTHO SPLIT 77X108 STRL (DRAPES) ×2
DRAPE SURG ORHT 6 SPLT 77X108 (DRAPES) ×2 IMPLANT
DRAPE U-SHAPE 47X51 STRL (DRAPES) ×2 IMPLANT
DRSG AQUACEL AG ADV 3.5X10 (GAUZE/BANDAGES/DRESSINGS) ×1 IMPLANT
ELECT BLADE TIP CTD 4 INCH (ELECTRODE) ×1 IMPLANT
ELECT REM PT RETURN 15FT ADLT (MISCELLANEOUS) ×1 IMPLANT
GAUZE SPONGE 4X4 12PLY STRL (GAUZE/BANDAGES/DRESSINGS) ×2 IMPLANT
GLENOID SPHERE 36+6 (Joint) IMPLANT
GLOVE BIOGEL PI IND STRL 6.5 (GLOVE) ×1 IMPLANT
GLOVE BIOGEL PI IND STRL 8 (GLOVE) ×1 IMPLANT
GLOVE ECLIPSE 6.5 STRL STRAW (GLOVE) ×1 IMPLANT
GLOVE ECLIPSE 8.0 STRL XLNG CF (GLOVE) ×1 IMPLANT
GOWN STRL REUS W/ TWL LRG LVL3 (GOWN DISPOSABLE) ×1 IMPLANT
GOWN STRL REUS W/ TWL XL LVL3 (GOWN DISPOSABLE) ×1 IMPLANT
GOWN STRL REUS W/TWL LRG LVL3 (GOWN DISPOSABLE) ×1
GOWN STRL REUS W/TWL XL LVL3 (GOWN DISPOSABLE) ×1
GUIDE BONE RSA SHLD ROT RT (ORTHOPEDIC DISPOSABLE SUPPLIES) IMPLANT
HYDROGEN PEROXIDE 16OZ (MISCELLANEOUS) ×1 IMPLANT
JET LAVAGE IRRISEPT WOUND (IRRIGATION / IRRIGATOR) ×1
KIT BASIN OR (CUSTOM PROCEDURE TRAY) ×1 IMPLANT
KIT TURNOVER KIT A (KITS) IMPLANT
LAVAGE JET IRRISEPT WOUND (IRRIGATION / IRRIGATOR) ×1 IMPLANT
MANIFOLD NEPTUNE II (INSTRUMENTS) ×1 IMPLANT
NDL SUT 6 .5 CRC .975X.05 MAYO (NEEDLE) IMPLANT
NEEDLE MAYO TAPER (NEEDLE) ×1
NS IRRIG 1000ML POUR BTL (IV SOLUTION) ×1 IMPLANT
PACK SHOULDER (CUSTOM PROCEDURE TRAY) ×1 IMPLANT
PAD COLD SHLDR WRAP-ON (PAD) ×1 IMPLANT
PIN THREADED REVERSE (PIN) IMPLANT
REAMER GUIDE BUSHING SURG DISP (MISCELLANEOUS) IMPLANT
REAMER GUIDE W/SCREW AUG (MISCELLANEOUS) IMPLANT
RESTRAINT HEAD UNIVERSAL NS (MISCELLANEOUS) ×1 IMPLANT
RETRIEVER SUT HEWSON (MISCELLANEOUS) ×1 IMPLANT
SCREW BONE STRL 6.5MMX25MM (Screw) IMPLANT
SCREW LOCKING 4.75MMX15MM (Screw) IMPLANT
SCREW LOCKING STRL 4.75X25X3.5 (Screw) IMPLANT
SHOULDER HUMERAL BEAR 36M STD (Shoulder) ×1 IMPLANT
SLING ARM IMMOBILIZER LRG (SOFTGOODS) ×1 IMPLANT
SPONGE T-LAP 4X18 ~~LOC~~+RFID (SPONGE) ×1 IMPLANT
STEM HUMERAL STRL 9MMX83MM (Stem) IMPLANT
STRIP CLOSURE SKIN 1/2X4 (GAUZE/BANDAGES/DRESSINGS) ×1 IMPLANT
SUCTION TUBE FRAZIER 10FR DISP (SUCTIONS) IMPLANT
SUT BROADBAND TAPE 2PK 1.5 (SUTURE) IMPLANT
SUT FIBERWIRE #2 38 T-5 BLUE (SUTURE)
SUT MAXBRAID (SUTURE) IMPLANT
SUT MNCRL AB 3-0 PS2 18 (SUTURE) ×1 IMPLANT
SUT SILK 2 0 (SUTURE) ×1
SUT SILK 2-0 18XBRD TIE 12 (SUTURE) ×1 IMPLANT
SUT VIC AB 0 CT1 27 (SUTURE) ×4
SUT VIC AB 0 CT1 27XBRD ANBCTR (SUTURE) ×4 IMPLANT
SUT VIC AB 1 CT1 27 (SUTURE) ×2
SUT VIC AB 1 CT1 27XBRD ANBCTR (SUTURE) ×2 IMPLANT
SUT VIC AB 1 CT1 36 (SUTURE) IMPLANT
SUT VIC AB 2-0 CT1 27 (SUTURE) ×3
SUT VIC AB 2-0 CT1 TAPERPNT 27 (SUTURE) ×3 IMPLANT
SUT VICRYL 0 UR6 27IN ABS (SUTURE) ×2 IMPLANT
SUTURE FIBERWR #2 38 T-5 BLUE (SUTURE) IMPLANT
TOWEL OR 17X26 10 PK STRL BLUE (TOWEL DISPOSABLE) ×1 IMPLANT
TRAY HUM REV SHOULDER STD +6 (Shoulder) IMPLANT
WATER STERILE IRR 1000ML POUR (IV SOLUTION) ×1 IMPLANT

## 2023-07-18 NOTE — Anesthesia Procedure Notes (Signed)
Anesthesia Regional Block: Interscalene brachial plexus block   Pre-Anesthetic Checklist: , timeout performed,  Correct Patient, Correct Site, Correct Laterality,  Correct Procedure, Correct Position, site marked,  Risks and benefits discussed,  Surgical consent,  Pre-op evaluation,  At surgeon's request and post-op pain management  Laterality: Right  Prep: chloraprep       Needles:  Injection technique: Single-shot  Needle Type: Stimiplex     Needle Length: 9cm  Needle Gauge: 21     Additional Needles:   Procedures:,,,, ultrasound used (permanent image in chart),,    Narrative:  Start time: 07/18/2023 6:53 AM End time: 07/18/2023 6:58 AM Injection made incrementally with aspirations every 5 mL.  Performed by: Personally  Anesthesiologist: Lowella Curb, MD

## 2023-07-18 NOTE — Anesthesia Postprocedure Evaluation (Signed)
Anesthesia Post Note  [name]  Procedure(s) Performed: RIGHT REVERSE SHOULDER ARTHROPLASTY (Right: Shoulder) BICEPS TENODESIS (Right: Shoulder)     Patient location during evaluation: PACU Anesthesia Type: General Level of consciousness: awake and alert Pain management: pain level controlled Vital Signs Assessment: post-procedure vital signs reviewed and stable Respiratory status: spontaneous breathing, nonlabored ventilation and respiratory function stable Cardiovascular status: blood pressure returned to baseline and stable Postop Assessment: no apparent nausea or vomiting Anesthetic complications: no   No notable events documented.  Last Vitals:  Vitals:   07/18/23 1130 07/18/23 1145  BP: (!) 131/57 (!) 134/59  Pulse: 71 72  Resp: 17 18  Temp:    SpO2: 93% 93%    Last Pain:  Vitals:   07/18/23 1145  TempSrc:   PainSc: 8                  Lowella Curb

## 2023-07-18 NOTE — Transfer of Care (Signed)
Immediate Anesthesia Transfer of Care Note  [name]  Procedure(s) Performed: RIGHT REVERSE SHOULDER ARTHROPLASTY (Right: Shoulder) BICEPS TENODESIS (Right: Shoulder)  Patient Location: PACU  Anesthesia Type:GA combined with regional for post-op pain  Level of Consciousness: awake, drowsy, and responds to stimulation  Airway & Oxygen Therapy: Patient Spontanous Breathing and Patient connected to face mask oxygen  Post-op Assessment: Report given to RN, Post -op Vital signs reviewed and stable, and B/P 110/56  Post vital signs: Reviewed and stable  Last Vitals:  Vitals Value Taken Time  BP    Temp    Pulse 62 07/18/23 1035  Resp 15 07/18/23 1035  SpO2 95 % 07/18/23 1035  Vitals shown include unfiled device data.  Last Pain:  Vitals:   07/18/23 0614  TempSrc: Oral  PainSc:          Complications: No notable events documented.

## 2023-07-18 NOTE — Anesthesia Procedure Notes (Signed)
Procedure Name: Intubation Date/Time: 07/18/2023 7:38 AM  Performed by: Vanessa Arapahoe, CRNAPre-anesthesia Checklist: Patient identified, Emergency Drugs available, Suction available and Patient being monitored Patient Re-evaluated:Patient Re-evaluated prior to induction Oxygen Delivery Method: Circle system utilized Preoxygenation: Pre-oxygenation with 100% oxygen Induction Type: IV induction Ventilation: Mask ventilation without difficulty Laryngoscope Size: 2 and Miller Grade View: Grade I Tube type: Oral Tube size: 7.0 mm Number of attempts: 1 Airway Equipment and Method: Stylet Placement Confirmation: ETT inserted through vocal cords under direct vision, positive ETCO2 and breath sounds checked- equal and bilateral Secured at: 21 cm Tube secured with: Tape Dental Injury: Teeth and Oropharynx as per pre-operative assessment  Comments: Slightly anterior

## 2023-07-18 NOTE — Plan of Care (Signed)
  Problem: Education: °Goal: Knowledge of the prescribed therapeutic regimen will improve °Outcome: Progressing °Goal: Understanding of activity limitations/precautions following surgery will improve °Outcome: Progressing °Goal: Individualized Educational Video(s) °Outcome: Progressing °  °

## 2023-07-18 NOTE — Op Note (Signed)
NAMEEMMYLOU, CRECELIUS MEDICAL RECORD NO: [id number] ACCOUNT NO: [id number] DATE OF BIRTH: 04/26/1946 FACILITY: [facility] LOCATION: [facility] PHYSICIAN: Graylin Shiver. August Saucer, MD  Operative Report   DATE OF PROCEDURE: 07/18/2023  PREOPERATIVE DIAGNOSES:  Right shoulder arthritis and biceps tendinitis.  POSTOPERATIVE DIAGNOSES:  Right shoulder arthritis and biceps tendinitis.  PROCEDURE:  Right shoulder reverse shoulder replacement using Biomet comprehensive shoulder system including small augmented baseplate with glenosphere 36+6 and size 9 mini humeral stem with 36 standard bearing cross-linked polyethylene and 40 mm mini  humeral tray +6 offset.  Biceps tenodesis.  SURGEON ATTENDING:  Graylin Shiver. August Saucer, MD  ASSISTANT:  Karenann Cai.  INDICATIONS:  The patient is a 77 year old patient with end-stage right shoulder arthritis, who presents for operative management after explanation of risks and benefits.  PROCEDURE IN DETAIL:  The patient was brought to the operating room where general endotracheal anesthesia was induced.  Preoperative antibiotics administered.  Timeout was called.  The patient was placed in the beach chair position with the cervical  spine in neutral position.  Right shoulder, arm and hand prescrubbed with first hydrogen peroxide followed by alcohol and then Betadine, which was allowed to air dry and then ChloraPrep solution.  Collier Flowers was used to seal the operative field as well as  covered the operative field.  After sterile prepping and draping, timeout was called.  Deltopectoral approach was made.  Skin and subcutaneous tissue were sharply divided.  Cephalic vein mobilized laterally.  Branches were ligated.  The deltoid was  released off its anterior attachment manually.  Subdeltoid adhesions were released.  The upper 1.5 cm of the pec was also released.  Kolbel retractor placed.  Axillary nerve palpated and protected at all times during the case.  The biceps tendon was  tenodesed to the pec  tendon using five 0 Vicryl sutures.  The proximal portion of the biceps tendon was then used to open up the rotator interval down to  back to the base of the coracoid.  At this time, the circumflex vessels were ligated.  Subscap was  detached using a 15 blade along with the capsule.  This capsular dissection was taken down 2 cm off the inferior humeral neck down to the 5 o'clock position on the humerus.  Significant arthritis was present. The humeral head was dislocated at this time.   Round retractor placed.  Reaming was performed on the humerus up to a size 10.  The head was then cut in about 30 degrees of retroversion comparable to the patient's native version.  Broaching then performed up to size 9, which gave very good fit.  The  cap was placed and then attention was directed towards the glenoid.  At this time, posterior retractor was placed along with anterior retractor.  With care being taken to avoid injury to the axillary nerve, the labrum was circumferentially excised.   Next, the patient-specific guide was placed on the glenoid.  Rotator interval was opened up to the base of the coracoid to increase subscap mobilization.  Guide pins were placed.  Reaming was performed in accordance with preoperative templating to a  measured distance of 4.5 mm.  Superior reaming was then performed for augment.  At this time, the baseplate was templated and found to have very good fit on the prepared bone.  True baseplate was placed with one central compression screw and four  peripheral locking screws.  Next, we did trial a +3 and +6 glenosphere 36 millimeters.  The +6 gave optimal stability.  With that, we paired it with a +6 humeral tray offset standard bearing.  The +6 gave optimal stability and tension while allowing for  subscap repair to be performed at 30 degrees of external rotation.  Arm was stable with adduction, extension as well as a forward force as well as with internal and external rotation, 90  degrees of abduction.  The construct was "two fingers tight."   Reduction and relocation difficult.  At this time, trial components were removed.  I placed the +6 after irrigating with IrriSept solution, which was used after the incision as well as after the arthrotomy.  We placed the glenosphere on the dried Morse  taper on the baseplate.  We also at this time removed the trial humeral stem and irrigated with IrriSept solution, placed 6 SutureTapes for subscap repair and then removed that IrriSept solution, placed vancomycin in the canal and placed the humeral stem  with very good bony contact and stability achieved.  Same stability was achieved with the +6 humeral tray and standard liner.  This was the one that was placed with very good stability.  Thorough irrigation performed at this time, that was with 3 liters  of irrigating solution.  Axillary nerve palpated and found to be intact.  Subscap then repaired using the six SutureTapes with the arm in 30 degrees of external rotation.  At this time, IrriSept solution used to irrigate the implant and vancomycin  powder placed on the implant.  Deltopectoral incision was then closed using #1 Vicryl suture followed by interrupted inverted 0 Vicryl suture, 2-0 Vicryl suture, and 3-0 Monocryl with Steri-Strips.  Aquacel dressing applied.  Shoulder sling applied.  The  patient tolerated the procedure well without immediate complications, transferred to the recovery room in stable condition.  Luke's assistance was required at all times for retraction, opening, closing, mobilization of tissue, reduction, implant  placement.  His assistance was a medical necessity.   VAI D: 07/18/2023 10:25:08 am T: 07/18/2023 10:46:00 am  JOB: [id number]/ [id number]

## 2023-07-18 NOTE — Brief Op Note (Signed)
   07/18/2023  10:17 AM  PATIENT:  Lindsey Williams  78 y.o. female  PRE-OPERATIVE DIAGNOSIS:  right rotator cuff arthropathy, biceps tendinitis  POST-OPERATIVE DIAGNOSIS:  right rotator cuff arthropathy, biceps tendinitis  PROCEDURE:  Procedure(s): RIGHT REVERSE SHOULDER ARTHROPLASTY BICEPS TENODESIS  SURGEON:  Surgeon(s): August Saucer, Corrie Mckusick, MD  ASSISTANT: Karenann Cai, PA  ANESTHESIA:   general  EBL: 75 ml    Total I/O In: 200 [IV Piggyback:200] Out: 100 [Blood:100]  BLOOD ADMINISTERED: none  DRAINS: none   LOCAL MEDICATIONS USED: Vancomycin powder  SPECIMEN:  No Specimen  COUNTS:  YES  TOURNIQUET:  * No tourniquets in log *  DICTATION: . [id number] PLAN OF CARE: Admit for overnight observation  PATIENT DISPOSITION:  PACU - hemodynamically stable

## 2023-07-18 NOTE — H&P (Addendum)
Lindsey Williams is an 77 y.o. female.   Chief Complaint: right shoulder pain HPI: Lindsey Williams is a 77 y.o. female who presents  reporting right shoulder pain.  Since she was last seen she had a CT scan which shows moderate glenohumeral joint arthritis with findings consistent with rotator cuff arthropathy.    Patient reports a lot of pain at night.  She feels a catching in the right shoulder.  She also describes weakness with any type of overhead motion.  This is something that is giving her enough functional disability that she does not really want to live with it.  She has tried and failed conservative treatment measures..    Past Medical History:  Diagnosis Date   Cancer (HCC)    bladder ca in 1989   COPD (chronic obstructive pulmonary disease) (HCC)    Diabetes mellitus    GERD (gastroesophageal reflux disease)    Hypertension    Nausea & vomiting    Osteoarthritis of left knee 10/07/2011   Pneumonia    HX PE 1972 SUPERFICIAL PHLEBITIS 2010   PVC's (premature ventricular contractions)     Past Surgical History:  Procedure Laterality Date   ABDOMINAL HYSTERECTOMY     1989   ARCUATE KERATECTOMY     BIOPSY  03/21/2023   Procedure: BIOPSY;  Surgeon: Lanelle Bal, DO;  Location: [facility];  Service: Gastroenterology;;   BREAST BIOPSY     BIL   CARDIAC CATHETERIZATION     8 YRS AGO/ STRESS ECHO 8 YRS AGO   CARPAL TUNNEL RELEASE     BILATERAL   CHOLECYSTECTOMY     COLONOSCOPY WITH PROPOFOL N/A 03/21/2023   Procedure: COLONOSCOPY WITH PROPOFOL;  Surgeon: Lanelle Bal, DO;  Location: [facility];  Service: Gastroenterology;  Laterality: N/A;  1:15pm; ASA 3   CYSTOSCOPY     YEARLY   ESOPHAGOGASTRODUODENOSCOPY (EGD) WITH PROPOFOL N/A 03/21/2023   Procedure: ESOPHAGOGASTRODUODENOSCOPY (EGD) WITH PROPOFOL;  Surgeon: Lanelle Bal, DO;  Location: [facility];  Service: Gastroenterology;  Laterality: N/A;  1:15 pm;ASA 3   EYE SURGERY     HEMORROIDECTOMY     INGUINAL  HERNIA REPAIR     JOINT REPLACEMENT     RT TOTAL KNEE 2011   KNEE ARTHROPLASTY  10/05/2011   Procedure: COMPUTER ASSISTED TOTAL KNEE ARTHROPLASTY;  Surgeon: Kathryne Hitch;  Location: [facility];  Service: Orthopedics;  Laterality: Left;  preop femoral nerve block   NONE  09/26/2011   NO PREVIOUS SURG   NONE     POLYPECTOMY  03/21/2023   Procedure: POLYPECTOMY;  Surgeon: Lanelle Bal, DO;  Location: [facility];  Service: Gastroenterology;;   ULNAR NERVE REPAIR     BIL   WRIST ARTHROSCOPY     BIL    History reviewed. No pertinent family history. Social History:  reports that she has never smoked. She has been exposed to tobacco smoke. She has never used smokeless tobacco. She reports that she does not drink alcohol and does not use drugs.  Allergies:  Allergies  Allergen Reactions   Erythromycin Shortness Of Breath and Other (See Comments)    Had purple spots on body   Tape     adhesive tape-only if on patient for long periods of time, like days.   Epinephrine     Uncontrollable shakes   Losartan Cough   Other Nausea And Vomiting    Pain medications    Medications Prior to Admission  Medication  Sig Dispense Refill   acetaminophen-codeine (TYLENOL #3) 300-30 MG tablet Take 1-2 tablets by mouth every 8 (eight) hours as needed for moderate pain. 30 tablet 0   albuterol (VENTOLIN HFA) 108 (90 Base) MCG/ACT inhaler Inhale 2 puffs into the lungs every 6 (six) hours as needed for shortness of breath.     aspirin EC 81 MG tablet Take 81 mg by mouth daily.     calcium-vitamin D (OSCAL WITH D) 500-200 MG-UNIT per tablet Take 0.5 tablets by mouth daily.       Cholecalciferol (D-3-5) 5000 units capsule Take 5,000 Units by mouth daily.     furosemide (LASIX) 40 MG tablet Take 40 mg by mouth in the morning.     metoprolol tartrate (LOPRESSOR) 50 MG tablet Take 100 mg by mouth in the morning and at bedtime.     MOUNJARO 5 MG/0.5ML Pen Inject 5 mg into the skin once a week.      omeprazole (PRILOSEC) 40 MG capsule Take 1 capsule (40 mg total) by mouth 2 (two) times daily. 90 capsule 3   potassium chloride SA (KLOR-CON M) 20 MEQ tablet Take 20 mEq by mouth daily.     ALPRAZolam (XANAX) 0.5 MG tablet Take 0.5 mg by mouth 2 (two) times daily as needed for anxiety.     BREYNA 160-4.5 MCG/ACT inhaler Inhale 2 puffs into the lungs in the morning and at bedtime.     furosemide (LASIX) 20 MG tablet Take 40 mg by mouth daily. (Patient not taking: Reported on 07/08/2023)     MOUNJARO 2.5 MG/0.5ML Pen Inject into the skin. (Patient not taking: Reported on 07/08/2023)     nadolol (CORGARD) 40 MG tablet Take 60 mg by mouth daily. (Patient not taking: Reported on 07/08/2023)      Results for orders placed or performed during the hospital encounter of 07/18/23 (from the past 48 hour(s))  Glucose, capillary     Status: Abnormal   Collection Time: 07/18/23  5:49 AM  Result Value Ref Range   Glucose-Capillary 103 (H) 70 - 99 mg/dL    Comment: Glucose reference range applies only to samples taken after fasting for at least 8 hours.   No results found.  Review of Systems  Musculoskeletal:  Positive for arthralgias.  All other systems reviewed and are negative.   Blood pressure (!) 162/78, pulse 64, temperature 98 F (36.7 C), temperature source Oral, resp. rate 16, height 5\' 4"  (1.626 m), weight 108 kg, SpO2 97%. Physical Exam Vitals reviewed.  HENT:     Head: Normocephalic.     Nose: Nose normal.     Mouth/Throat:     Mouth: Mucous membranes are moist.  Eyes:     Pupils: Pupils are equal, round, and reactive to light.  Cardiovascular:     Rate and Rhythm: Normal rate.     Pulses: Normal pulses.  Pulmonary:     Effort: Pulmonary effort is normal.  Abdominal:     General: Abdomen is flat.  Musculoskeletal:     Cervical back: Normal range of motion.  Skin:    General: Skin is warm.     Capillary Refill: Capillary refill takes less than 2 seconds.  Neurological:      General: No focal deficit present.     Mental Status: She is alert.  Psychiatric:        Mood and Affect: Mood normal.    Ortho exam demonstrates passive range of motion of 70/90/160. Does have some weakness to  infraspinatus and supraspinatus testing on the right but subscap strength is 5+ out of 5. No Popeye deformity but forward flexion and abduction are achievable over 90 degrees. There is weakness with overhead resistance testing. Does have tenderness in the bicipital groove but no AC joint tenderness.   Assessment/Plan Impression is rotator cuff arthropathy right shoulder with fairly maintained passive motion as well as forward flexion and abduction. Rotator cuff pathology non reparable. She does have significant pain greater than weakness but there is also component of weakness with overhead attempts at motion. Plan at this time is right shoulder reverse shoulder replacement and biceps tenodesis. The risk and benefits are discussed with the patient including not limited to infection or vessel damage incomplete pain relief as well as incomplete restoration of function. Expected nature of the rehabilitative process is also discussed including the use of a CPM brace or chair. Patient understands the risk and benefits and wishes to proceed. All questions answered   Burnard Bunting, MD 07/18/2023, 6:25 AM

## 2023-07-19 ENCOUNTER — Encounter (HOSPITAL_COMMUNITY): Payer: Self-pay | Admitting: Orthopedic Surgery

## 2023-07-19 DIAGNOSIS — M7521 Bicipital tendinitis, right shoulder: Secondary | ICD-10-CM | POA: Diagnosis not present

## 2023-07-19 MED ORDER — DOCUSATE SODIUM 100 MG PO CAPS: 100.0000 mg | ORAL_CAPSULE | Freq: Two times a day (BID) | ORAL | 0 refills | Status: AC

## 2023-07-19 MED ORDER — ASPIRIN 81 MG PO TBEC: 81.0000 mg | DELAYED_RELEASE_TABLET | Freq: Every day | ORAL | 12 refills | Status: AC

## 2023-07-19 MED ORDER — OXYCODONE HCL 5 MG PO TABS: 5.0000 mg | ORAL_TABLET | ORAL | 0 refills | Status: DC | PRN

## 2023-07-19 MED ORDER — TIZANIDINE HCL 2 MG PO TABS: 2.0000 mg | ORAL_TABLET | Freq: Three times a day (TID) | ORAL | 0 refills | Status: DC | PRN

## 2023-07-19 MED ORDER — ONDANSETRON HCL 4 MG PO TABS: 4.0000 mg | ORAL_TABLET | Freq: Four times a day (QID) | ORAL | 0 refills | Status: DC | PRN

## 2023-07-19 MED ORDER — OYSTER SHELL CALCIUM/D3 500-5 MG-MCG PO TABS
0.5000 | ORAL_TABLET | Freq: Every day | ORAL | Status: DC
  Administered 2023-07-19: 0.5 via ORAL
  Filled 2023-07-19: qty 1

## 2023-07-19 NOTE — Progress Notes (Signed)
   07/19/23 0830  TOC Brief Assessment  Insurance and Status Reviewed  Patient has primary care physician Yes  Home environment has been reviewed Resides in single family home  Prior level of function: Independent at baseline  Prior/Current Home Services No current home services  Social Determinants of Health Reivew SDOH reviewed no interventions necessary  Readmission risk has been reviewed Yes  Transition of care needs no transition of care needs at this time

## 2023-07-19 NOTE — Evaluation (Addendum)
Occupational Therapy Evaluation Patient Details Name: Lindsey Williams MRN: [id number] DOB: 31-Dec-1945 Today's Date: 07/19/2023   History of Present Illness 77 y.o. female admitted 07/18/23 for R reverse TSA. PMH: bladder cancer, COPD, HTN, DM.   Clinical Impression   Pt required min A to don/doff shoulder immobilizer with education provided to maintain sling all of the time with the exception of bathing, dressing and when completing exercises until the surgeon states otherwise. R shoulder ER 30 degrees, hand wrist and elbow ROM to tolerance  exercises completed with Sup and HEP provided, see details below. Pt was educated on how to complete UB ADLs while maintaining shoulder ROM restrictions with compensatory techniques, pt needs min A to don shirt and complete UB bathing. Overall pt also needed min A for LB ADLs and Mod I for functional mobility with cane/HHA. Shoulder protocol educational hand out provided to encourage carry-over at discharge. All education completed, no further acute OT services are indicated at this time with plan to follow the surgeons follow up therapy plan. Pt ready for d/c from OT standpoint        If plan is discharge home, recommend the following: A lot of help with bathing/dressing/bathroom;Assistance with cooking/housework;Assist for transportation    Functional Status Assessment  Patient has had a recent decline in their functional status and demonstrates the ability to make significant improvements in function in a reasonable and predictable amount of time.  Equipment Recommendations  None recommended by OT    Recommendations for Other Services       Precautions / Restrictions Precautions Precautions: Shoulder Type of Shoulder Precautions: R reverse TSA Shoulder Interventions: Shoulder sling/immobilizer Precaution Booklet Issued: Yes (comment) Precaution Comments: Pt educated on shoulder protocol, sling wear, ice machine and ROM exercises as allowed by  surgeon; handouts provided Restrictions Weight Bearing Restrictions: Yes RUE Weight Bearing: Non weight bearing      Mobility Bed Mobility               General bed mobility comments: up in recliner    Transfers Overall transfer level: Modified independent Equipment used: None Transfers: Sit to/from Stand Sit to Stand: Modified independent (Device/Increase time)                  Balance                                           ADL either performed or assessed with clinical judgement   ADL Overall ADL's : Needs assistance/impaired Eating/Feeding: Set up   Grooming: Wash/dry hands;Wash/dry face;Modified independent;Standing   Upper Body Bathing: Minimal assistance   Lower Body Bathing: Minimal assistance   Upper Body Dressing : Minimal assistance   Lower Body Dressing: Minimal assistance   Toilet Transfer: Modified Independent;Ambulation;Regular Social worker and Hygiene: Supervision/safety   Tub/ Engineer, structural: Supervision/safety   Functional mobility during ADLs: Supervision/safety;Modified independent General ADL Comments: pt educated on ADL compensatory techniques     Vision Baseline Vision/History: 1 Wears glasses Ability to See in Adequate Light: 0 Adequate Patient Visual Report: No change from baseline       Perception         Praxis         Pertinent Vitals/Pain Pain Assessment Pain Assessment: 0-10 Pain Score: 1  Pain Location: R shoulder Pain Descriptors / Indicators: Sore Pain Intervention(s): Monitored during session, Repositioned, Ice  applied     Extremity/Trunk Assessment Upper Extremity Assessment Upper Extremity Assessment: RUE deficits/detail RUE: Unable to fully assess due to immobilization   Lower Extremity Assessment Lower Extremity Assessment: Defer to PT evaluation   Cervical / Trunk Assessment Cervical / Trunk Assessment: Normal   Communication  Communication Communication: Hearing impairment   Cognition Arousal: Alert Behavior During Therapy: WFL for tasks assessed/performed Overall Cognitive Status: Within Functional Limits for tasks assessed                                       General Comments       Exercises     Shoulder Instructions      Home Living Family/patient expects to be discharged to:: Private residence Living Arrangements: Alone Available Help at Discharge: Family;Available PRN/intermittently Type of Home: House       Home Layout: Multi-level Alternate Level Stairs-Number of Steps: split level, has 2 sets of stairs one with L rail, one with B rails Alternate Level Stairs-Rails: Left Bathroom Shower/Tub: Walk-in shower;Tub/shower unit   Bathroom Toilet: Standard     Home Equipment: Cane - quad          Prior Functioning/Environment Prior Level of Function : Independent/Modified Independent             Mobility Comments: walked without AD, denies falls in past 6 months ADLs Comments: Ind with ADLs, IADLs, home mgt, drives        OT Problem List: Decreased range of motion;Decreased activity tolerance;Impaired UE functional use;Pain      OT Treatment/Interventions:      OT Goals(Current goals can be found in the care plan section) Acute Rehab OT Goals Patient Stated Goal: go home  OT Frequency:      Co-evaluation              AM-PAC OT "6 Clicks" Daily Activity     Outcome Measure Help from another person eating meals?: None Help from another person taking care of personal grooming?: None Help from another person toileting, which includes using toliet, bedpan, or urinal?: A Little Help from another person bathing (including washing, rinsing, drying)?: A Little Help from another person to put on and taking off regular upper body clothing?: A Little Help from another person to put on and taking off regular lower body clothing?: A Little 6 Click Score: 20    End of Session Equipment Utilized During Treatment: Gait belt Nurse Communication: Mobility status  Activity Tolerance: Patient tolerated treatment well Patient left: in chair  OT Visit Diagnosis: Muscle weakness (generalized) (M62.81);Pain Pain - Right/Left: Right Pain - part of body: Shoulder                Time: 1610-9604 OT Time Calculation (min): 52 min Charges:  OT General Charges $OT Visit: 1 Visit OT Evaluation $OT Eval Low Complexity: 1 Low OT Treatments $Self Care/Home Management : 8-22 mins $Therapeutic Activity: 8-22 mins   Galen Manila 07/19/2023, 11:38 AM

## 2023-07-19 NOTE — Progress Notes (Signed)
  Subjective: Patient stable.  Pain controlled.  Having some nausea however.   Objective: Vital signs in last 24 hours: Temp:  [97.5 F (36.4 C)-98.2 F (36.8 C)] 98.2 F (36.8 C) (08/23 0641) Pulse Rate:  [62-84] 65 (08/23 0641) Resp:  [15-18] 15 (08/23 0641) BP: (110-156)/(53-78) 155/72 (08/23 0641) SpO2:  [92 %-98 %] 96 % (08/23 0641)  Intake/Output from previous day: 08/22 0701 - 08/23 0700 In: 3154.3 [P.O.:780; I.V.:1919.3; IV Piggyback:455.1] Out: 1000 [Urine:900; Blood:100] Intake/Output this shift: No intake/output data recorded.  Exam:  No cellulitis present Compartment soft  Labs: Recent Labs    07/18/23 0637  HGB 11.8*   Recent Labs    07/18/23 0637  WBC 6.4  RBC 4.56  HCT 38.4  PLT 373   Recent Labs    07/18/23 0637  NA 139  K 4.0  CL 107  CO2 21*  BUN 15  CREATININE 0.96  GLUCOSE 111*  CALCIUM 9.1   No results for input(s): "LABPT", "INR" in the last 72 hours.  Assessment/Plan: Plan at this time is occupational therapy and then discharged home this afternoon.  Patient has a friend at home.  Pain better controlled now.  Plan to send home with some Zofran in case she has nausea.  She does have a CPM machine at home.   G Scott Korrie Hofbauer 07/19/2023, 8:10 AM

## 2023-07-19 NOTE — Care Management Obs Status (Signed)
MEDICARE OBSERVATION STATUS NOTIFICATION   Patient Details  Name: Lindsey Williams MRN: [id number] Date of Birth: [birth year]   Medicare Observation Status Notification Given:  Yes    Ewing Schlein, LCSW 07/19/2023, 11:18 AM

## 2023-07-19 NOTE — Evaluation (Signed)
Physical Therapy Evaluation Patient Details Name: Lindsey Williams MRN: [id number] DOB: [birth year] Today's Date: 07/19/2023  History of Present Illness  77 y.o. female admitted 07/18/23 for R reverse TSA. PMH: bladder cancer, COPD, HTN, DM.  Clinical Impression  Pt ambulated 63' with a straight cane without loss of balance, at baseline she ambulates without an assistive device. Pt was able to ascend/descend 3 stairs with a L rail without loss of balance. She is ready to DC home from a PT standpoint.         If plan is discharge home, recommend the following: A little help with bathing/dressing/bathroom;Assistance with cooking/housework;Assist for transportation   Can travel by private vehicle        Equipment Recommendations None recommended by PT  Recommendations for Other Services       Functional Status Assessment Patient has had a recent decline in their functional status and demonstrates the ability to make significant improvements in function in a reasonable and predictable amount of time.     Precautions / Restrictions Precautions Precautions: Shoulder Type of Shoulder Precautions: R reverse TSA Shoulder Interventions: Shoulder sling/immobilizer Restrictions Weight Bearing Restrictions: Yes RUE Weight Bearing: Non weight bearing      Mobility  Bed Mobility               General bed mobility comments: up in recliner    Transfers Overall transfer level: Modified independent Equipment used: None Transfers: Sit to/from Stand Sit to Stand: Modified independent (Device/Increase time)           General transfer comment: steady, no loss of balance, used armrest of recliner with LUE to push up    Ambulation/Gait Ambulation/Gait assistance: Supervision Gait Distance (Feet): 90 Feet Assistive device: Straight cane Gait Pattern/deviations: Step-through pattern, Decreased stride length Gait velocity: decr     General Gait Details: pt reported feeling mildly  unsteady but had no overt loss of balance, so issued SPC for ambulation and she reported feeling more steady, encouraged her to use her cane at home for now  Stairs Stairs: Yes Stairs assistance: Supervision Stair Management: One rail Left, Step to pattern, Forwards Number of Stairs: 3 General stair comments: steady, no loss of balance  Wheelchair Mobility     Tilt Bed    Modified Rankin (Stroke Patients Only)       Balance Overall balance assessment: Modified Independent                                           Pertinent Vitals/Pain Pain Assessment Pain Assessment: 0-10 Pain Score: 3  Pain Location: R shoulder Pain Descriptors / Indicators: Sore Pain Intervention(s): Limited activity within patient's tolerance, Monitored during session, Premedicated before session, Ice applied    Home Living Family/patient expects to be discharged to:: Private residence   Available Help at Discharge: Family;Available 24 hours/day         Alternate Level Stairs-Number of Steps: split level, has 2 sets of stairs one with L rail, one with B rails Home Layout: Multi-level Home Equipment: Cane - quad      Prior Function Prior Level of Function : Independent/Modified Independent             Mobility Comments: walked without AD, denies falls in past 6 months       Extremity/Trunk Assessment   Upper Extremity Assessment Upper Extremity Assessment: Defer to OT evaluation  Lower Extremity Assessment Lower Extremity Assessment: Overall WFL for tasks assessed    Cervical / Trunk Assessment Cervical / Trunk Assessment: Normal  Communication   Communication Communication: Hearing impairment  Cognition Arousal: Alert Behavior During Therapy: WFL for tasks assessed/performed Overall Cognitive Status: Within Functional Limits for tasks assessed                                          General Comments      Exercises      Assessment/Plan    PT Assessment Patient does not need any further PT services  PT Problem List         PT Treatment Interventions      PT Goals (Current goals can be found in the Care Plan section)  Acute Rehab PT Goals PT Goal Formulation: All assessment and education complete, DC therapy    Frequency       Co-evaluation               AM-PAC PT "6 Clicks" Mobility  Outcome Measure Help needed turning from your back to your side while in a flat bed without using bedrails?: None Help needed moving from lying on your back to sitting on the side of a flat bed without using bedrails?: A Little Help needed moving to and from a bed to a chair (including a wheelchair)?: None Help needed standing up from a chair using your arms (e.g., wheelchair or bedside chair)?: None Help needed to walk in hospital room?: None Help needed climbing 3-5 steps with a railing? : None 6 Click Score: 23    End of Session Equipment Utilized During Treatment: Gait belt Activity Tolerance: Patient tolerated treatment well Patient left: in chair;with chair alarm set;with call Halterman/phone within reach Nurse Communication: Mobility status      Time: 5284-1324 PT Time Calculation (min) (ACUTE ONLY): 18 min   Charges:   PT Evaluation $PT Eval Moderate Complexity: 1 Mod   PT General Charges $$ ACUTE PT VISIT: 1 Visit        Tamala Ser PT 07/19/2023  Acute Rehabilitation Services  Office [phone]

## 2023-07-23 ENCOUNTER — Other Ambulatory Visit: Payer: Self-pay | Admitting: Surgical

## 2023-07-23 ENCOUNTER — Telehealth: Payer: Self-pay | Admitting: Orthopedic Surgery

## 2023-07-23 DIAGNOSIS — M7989 Other specified soft tissue disorders: Secondary | ICD-10-CM

## 2023-07-23 MED ORDER — OXYCODONE HCL 5 MG PO TABS: 5.0000 mg | ORAL_TABLET | ORAL | 0 refills | Status: DC | PRN

## 2023-07-23 MED ORDER — FLUCONAZOLE 150 MG PO TABS: 150.0000 mg | ORAL_TABLET | Freq: Once | ORAL | 0 refills | Status: AC

## 2023-07-23 NOTE — Telephone Encounter (Signed)
Patient called asked if Dr. August Saucer would sent in a Rx for her because after taking all of the pain medicine she has  got a yeast infection.  Patient asked if she can get two of the tabs.  Patient said her feet are so swollen that it is hard to walk on them. Patient said she has done everything she was told to do. Patient said she is in a lot of pain. Patient said the nerve block never worked.   The number to contact patient is [phone]

## 2023-07-23 NOTE — Telephone Encounter (Signed)
Sent in RX for pain med and diflucan to help with yeast infection. Is she taking her typical lasix? Sounds like she may have fluid overload from IV fluid and abx perioperatively

## 2023-07-23 NOTE — Telephone Encounter (Signed)
Pt is taking a baby aspirin as well but can't get her compression socks on, I advised pt to elevate legs above heart and ice legs as well as shoulder. I told her I would check back with her tomorrow to see if her swelling has gone down

## 2023-07-23 NOTE — Telephone Encounter (Signed)
Called and advised pt. She stated understanding. She is taking her lasix and elevating.

## 2023-07-24 ENCOUNTER — Ambulatory Visit (HOSPITAL_COMMUNITY)
Admission: RE | Admit: 2023-07-24 | Discharge: 2023-07-24 | Disposition: A | Discharge status: Home / Self Care | Payer: Medicare Other | Source: Ambulatory Visit | Attending: Cardiology | Admitting: Cardiology

## 2023-07-24 DIAGNOSIS — M7989 Other specified soft tissue disorders: Secondary | ICD-10-CM | POA: Insufficient documentation

## 2023-07-24 NOTE — Telephone Encounter (Signed)
Called and scheduled ultrasound for 1:30 today at [facility] @ [address]

## 2023-07-24 NOTE — Telephone Encounter (Signed)
Awesome, thanks for doing that, good idea if it didn't get better overnight

## 2023-07-24 NOTE — Telephone Encounter (Signed)
FYI, I called and advised pt. She will go and get ultrasound done as scheduled

## 2023-07-24 NOTE — Telephone Encounter (Signed)
Also to note I didn't advise pt to elevate shoulder. I meant to say I advised pt to also ice legs like the shoulder

## 2023-07-24 NOTE — Telephone Encounter (Signed)
FYI vascular results are in chart. No DVT. Anything else to advise pt about swelling?

## 2023-07-24 NOTE — Telephone Encounter (Signed)
I called and did followup with pt. She said tightness and swelling is getting worse. I advised we should move forward with u/s today to make sure it isnt a blood clot. Pt was ok with this plan.

## 2023-07-25 NOTE — Telephone Encounter (Signed)
Lvm advising  

## 2023-07-25 NOTE — Telephone Encounter (Signed)
Could try compression socks as well since there are no DVT

## 2023-07-26 NOTE — Discharge Summary (Signed)
Physician Discharge Summary      Patient ID: Lindsey Williams MRN: [id number] DOB/AGE: 1946-10-22 77 y.o.  Admit date: 07/18/2023 Discharge date: 07/19/2023  Admission Diagnoses:  Principal Problem:   S/P reverse total shoulder arthroplasty, right   Discharge Diagnoses:  Same  Surgeries: Procedure(s): RIGHT REVERSE SHOULDER ARTHROPLASTY BICEPS TENODESIS on 07/18/2023   Consultants:   Discharged Condition: Stable  Hospital Course: Lindsey Williams is an 77 y.o. 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  They were brought to the operating room on 07/18/2023 and underwent the above named procedures.  Pt awoke from anesthesia without complication and was transferred to the floor. On POD1, patient's pain was controlled, block still in effect.  No red flag signs or symptoms.  Discharged home on POD 1 after working with occupational therapy.  Pt will f/u with Dr. August Saucer in clinic in ~2 weeks.   Antibiotics given:  Anti-infectives (From admission, onward)    Start     Dose/Rate Route Frequency Ordered Stop   07/18/23 1500  ceFAZolin (ANCEF) IVPB 2g/100 mL premix        2 g 200 mL/hr over 30 Minutes Intravenous Every 8 hours 07/18/23 1353 07/19/23 0750   07/18/23 0923  vancomycin (VANCOCIN) powder  Status:  Discontinued          As needed 07/18/23 0923 07/18/23 1351   07/18/23 0600  ceFAZolin (ANCEF) IVPB 2g/100 mL premix        2 g 200 mL/hr over 30 Minutes Intravenous On call to O.R. 07/18/23 4098 07/18/23 0748     .  Recent vital signs:  Vitals:   07/19/23 0641 07/19/23 1026  BP: (!) 155/72 138/71  Pulse: 65 (!) 59  Resp: 15 17  Temp: 98.2 F (36.8 C) 97.6 F (36.4 C)  SpO2: 96% 94%    Recent laboratory studies:  Results for orders placed or performed during the hospital encounter of 07/18/23  CBC  Result Value Ref Range   WBC 6.4 4.0 - 10.5 K/uL   RBC 4.56 3.87 - 5.11 MIL/uL    Hemoglobin 11.8 (L) 12.0 - 15.0 g/dL   HCT 11.9 14.7 - 82.9 %   MCV 84.2 80.0 - 100.0 fL   MCH 25.9 (L) 26.0 - 34.0 pg   MCHC 30.7 30.0 - 36.0 g/dL   RDW 56.2 (H) 13.0 - 86.5 %   Platelets 373 150 - 400 K/uL   nRBC 0.0 0.0 - 0.2 %  Basic metabolic panel  Result Value Ref Range   Sodium 139 135 - 145 mmol/L   Potassium 4.0 3.5 - 5.1 mmol/L   Chloride 107 98 - 111 mmol/L   CO2 21 (L) 22 - 32 mmol/L   Glucose, Bld 111 (H) 70 - 99 mg/dL   BUN 15 8 - 23 mg/dL   Creatinine, Ser 7.84 0.44 - 1.00 mg/dL   Calcium 9.1 8.9 - 69.6 mg/dL   GFR, Estimated >29 >52 mL/min   Anion gap 11 5 - 15  Glucose, capillary  Result Value Ref Range   Glucose-Capillary 103 (H) 70 - 99 mg/dL  Glucose, capillary  Result Value Ref Range   Glucose-Capillary 172 (H) 70 - 99 mg/dL    Discharge Medications:   Allergies as of 07/19/2023       Reactions   Erythromycin Shortness Of Breath, Other (See Comments)   Had purple spots on body   Tape  adhesive tape-only if on patient for long periods of time, like days.   Epinephrine    Uncontrollable shakes   Losartan Cough   Other Nausea And Vomiting   Pain medications        Medication List     STOP taking these medications    nadolol 40 MG tablet Commonly known as: CORGARD       TAKE these medications    albuterol 108 (90 Base) MCG/ACT inhaler Commonly known as: VENTOLIN HFA Inhale 2 puffs into the lungs every 6 (six) hours as needed for shortness of breath.   ALPRAZolam 0.5 MG tablet Commonly known as: XANAX Take 0.5 mg by mouth 2 (two) times daily as needed for anxiety.   aspirin EC 81 MG tablet Take 1 tablet (81 mg total) by mouth daily. Swallow whole. What changed: additional instructions   Breyna 160-4.5 MCG/ACT inhaler Generic drug: budesonide-formoterol Inhale 2 puffs into the lungs in the morning and at bedtime.   calcium-vitamin D 500-200 MG-UNIT tablet Commonly known as: OSCAL WITH D Take 0.5 tablets by mouth daily.    D-3-5 125 MCG (5000 UT) capsule Generic drug: Cholecalciferol Take 5,000 Units by mouth daily.   docusate sodium 100 MG capsule Commonly known as: COLACE Take 1 capsule (100 mg total) by mouth 2 (two) times daily.   furosemide 40 MG tablet Commonly known as: LASIX Take 40 mg by mouth in the morning. What changed: Another medication with the same name was removed. Continue taking this medication, and follow the directions you see here.   metoprolol tartrate 50 MG tablet Commonly known as: LOPRESSOR Take 100 mg by mouth in the morning and at bedtime.   Mounjaro 5 MG/0.5ML Pen Generic drug: tirzepatide Inject 5 mg into the skin once a week. What changed: Another medication with the same name was removed. Continue taking this medication, and follow the directions you see here.   omeprazole 40 MG capsule Commonly known as: PRILOSEC Take 1 capsule (40 mg total) by mouth 2 (two) times daily.   ondansetron 4 MG tablet Commonly known as: ZOFRAN Take 1 tablet (4 mg total) by mouth every 6 (six) hours as needed for nausea.   potassium chloride SA 20 MEQ tablet Commonly known as: KLOR-CON M Take 20 mEq by mouth daily.   tiZANidine 2 MG tablet Commonly known as: ZANAFLEX Take 1 tablet (2 mg total) by mouth every 8 (eight) hours as needed for muscle spasms.        Diagnostic Studies: VAS [place] LOWER EXTREMITY VENOUS (DVT)  Result Date: 07/25/2023  Lower Venous DVT Study Patient Name:  [name]  Date of Exam:   07/24/2023 Medical Rec #: [id number]   Accession #:    [id number] Date of Birth: 04-24-1946  Patient Gender: F Patient Age:   89 years Exam Location:  [facility] Procedure:      VAS [place] LOWER EXTREMITY VENOUS (DVT) Referring Phys: Harriette Bouillon --------------------------------------------------------------------------------  Indications: Edema, and SOB. Other Indications: S/p shoulder surgery on 07/18/23. Three days later she noted                    significant edema in both legs  and feet, painful to put feet                    on the ground due to the swelling. Also notes shortness of  breath which she states is new. Comparison Study: 10/26/2019 negative for DVT bilaterally. Age-indeterminate                   left small saphenous vein thrombosis mid calf. Performing Technologist: [employer] BS, RVT, RDCS  Examination Guidelines: A complete evaluation includes B-mode imaging, spectral Doppler, color Doppler, and power Doppler as needed of all accessible portions of each vessel. Bilateral testing is considered an integral part of a complete examination. Limited examinations for reoccurring indications may be performed as noted. The reflux portion of the exam is performed with the patient in reverse Trendelenburg.  +---------+---------------+---------+-----------+----------+--------------+ RIGHT    CompressibilityPhasicitySpontaneityPropertiesThrombus Aging +---------+---------------+---------+-----------+----------+--------------+ CFV      Full           Yes      Yes                                 +---------+---------------+---------+-----------+----------+--------------+ SFJ      Full           Yes      Yes                                 +---------+---------------+---------+-----------+----------+--------------+ FV Prox  Full           Yes      Yes                                 +---------+---------------+---------+-----------+----------+--------------+ FV Mid   Full           Yes      Yes                                 +---------+---------------+---------+-----------+----------+--------------+ FV DistalFull           Yes      Yes                                 +---------+---------------+---------+-----------+----------+--------------+ PFV      Full           Yes      Yes                                 +---------+---------------+---------+-----------+----------+--------------+ POP      Full           Yes       Yes                                 +---------+---------------+---------+-----------+----------+--------------+ PTV      Full                    Yes                                 +---------+---------------+---------+-----------+----------+--------------+ PERO     Full                    Yes                                 +---------+---------------+---------+-----------+----------+--------------+  Gastroc  Full                                                        +---------+---------------+---------+-----------+----------+--------------+ GSV      Full           Yes      Yes                                 +---------+---------------+---------+-----------+----------+--------------+   +---------+---------------+---------+-----------+----------+--------------+ LEFT     CompressibilityPhasicitySpontaneityPropertiesThrombus Aging +---------+---------------+---------+-----------+----------+--------------+ CFV      Full           Yes      Yes                                 +---------+---------------+---------+-----------+----------+--------------+ SFJ      Full           Yes      Yes                                 +---------+---------------+---------+-----------+----------+--------------+ FV Prox  Full           Yes      Yes                                 +---------+---------------+---------+-----------+----------+--------------+ FV Mid   Full           Yes      Yes                                 +---------+---------------+---------+-----------+----------+--------------+ FV DistalFull           Yes      Yes                                 +---------+---------------+---------+-----------+----------+--------------+ PFV      Full           Yes      Yes                                 +---------+---------------+---------+-----------+----------+--------------+ POP      Full           Yes      Yes                                  +---------+---------------+---------+-----------+----------+--------------+ PTV      Full                    Yes                                 +---------+---------------+---------+-----------+----------+--------------+ PERO     Full  Yes                                 +---------+---------------+---------+-----------+----------+--------------+ Gastroc  Full           Yes      Yes                                 +---------+---------------+---------+-----------+----------+--------------+ GSV      Full           Yes      Yes                                 +---------+---------------+---------+-----------+----------+--------------+    Findings reported to Triage desk at 2:30pm.  Summary: BILATERAL: - No evidence of deep vein thrombosis seen in the lower extremities, bilaterally. - No evidence of superficial venous thrombosis in the lower extremities, bilaterally. -No evidence of popliteal cyst, bilaterally.   *See table(s) above for measurements and observations. Electronically signed by Lorine Bears MD on 07/25/2023 at 2:44:44 PM.    Final    DG Shoulder Right Port  Result Date: 07/18/2023 CLINICAL DATA:  Reverse shoulder arthroplasty. EXAM: RIGHT SHOULDER - 1 VIEW COMPARISON:  None Available. FINDINGS: Reverse right shoulder arthroplasty in expected alignment. No periprosthetic lucency or fracture. Recent postsurgical change includes air and edema in the joint space and soft tissues. IMPRESSION: Reverse right shoulder arthroplasty without immediate postoperative complication. Electronically Signed   By: Narda Rutherford M.D.   On: 07/18/2023 11:20    Disposition: Discharge disposition: 01-Home or Self Care       Discharge Instructions     Call MD / Call 911   Complete by: As directed    If you experience chest pain or shortness of breath, CALL 911 and be transported to the hospital emergency room.  If you develope a fever above 101 F, pus (white  drainage) or increased drainage or redness at the wound, or calf pain, call your surgeon's office.   Constipation Prevention   Complete by: As directed    Drink plenty of fluids.  Prune juice may be helpful.  You may use a stool softener, such as Colace (over the counter) 100 mg twice a day.  Use MiraLax (over the counter) for constipation as needed.   Diet - low sodium heart healthy   Complete by: As directed    Discharge instructions   Complete by: As directed    CPM machine 1 hour 3 times a day Okay to shower dressings are waterproof No lifting with the right arm Return to clinic in 2 weeks   Increase activity slowly as tolerated   Complete by: As directed    Post-operative opioid taper instructions:   Complete by: As directed    POST-OPERATIVE OPIOID TAPER INSTRUCTIONS: It is important to wean off of your opioid medication as soon as possible. If you do not need pain medication after your surgery it is ok to stop day one. Opioids include: Codeine, Hydrocodone(Norco, Vicodin), Oxycodone(Percocet, oxycontin) and hydromorphone amongst others.  Long term and even short term use of opiods can cause: Increased pain response Dependence Constipation Depression Respiratory depression And more.  Withdrawal symptoms can include Flu like symptoms Nausea, vomiting And more Techniques to manage these symptoms Hydrate well Eat regular healthy meals Stay active Use  relaxation techniques(deep breathing, meditating, yoga) Do Not substitute Alcohol to help with tapering If you have been on opioids for less than two weeks and do not have pain than it is ok to stop all together.  Plan to wean off of opioids This plan should start within one week post op of your joint replacement. Maintain the same interval or time between taking each dose and first decrease the dose.  Cut the total daily intake of opioids by one tablet each day Next start to increase the time between doses. The last dose  that should be eliminated is the evening dose.             SignedKarenann Cai 07/26/2023, 8:14 AM

## 2023-07-28 DIAGNOSIS — M19011 Primary osteoarthritis, right shoulder: Secondary | ICD-10-CM

## 2023-07-28 DIAGNOSIS — M7521 Bicipital tendinitis, right shoulder: Secondary | ICD-10-CM

## 2023-08-02 ENCOUNTER — Ambulatory Visit (INDEPENDENT_AMBULATORY_CARE_PROVIDER_SITE_OTHER): Payer: Medicare Other | Admitting: Surgical

## 2023-08-02 ENCOUNTER — Other Ambulatory Visit (INDEPENDENT_AMBULATORY_CARE_PROVIDER_SITE_OTHER): Payer: Medicare Other

## 2023-08-02 DIAGNOSIS — Z96611 Presence of right artificial shoulder joint: Secondary | ICD-10-CM

## 2023-08-02 MED ORDER — ACETAMINOPHEN-CODEINE 300-30 MG PO TABS: 1.0000 | ORAL_TABLET | Freq: Four times a day (QID) | ORAL | 0 refills | Status: DC | PRN

## 2023-08-02 MED ORDER — ONDANSETRON HCL 4 MG PO TABS: 4.0000 mg | ORAL_TABLET | Freq: Three times a day (TID) | ORAL | 0 refills | Status: AC | PRN

## 2023-08-02 NOTE — Progress Notes (Unsigned)
Post-Op Visit Note   [name]           Date of Birth: [birth year]           MRN: [id number] Visit Date: 08/02/2023 PCP: Valla Leaver, MD   Assessment & Plan:  Chief Complaint:  Chief Complaint  Patient presents with   Right Shoulder - Routine Post Op     right reverse shoulder arthroplasty on 07/18/2023   Visit Diagnoses:  1. S/P reverse total shoulder arthroplasty, right     Plan: Lindsey Williams is a 77 y.o. female who presents s/p right reverse shoulder arthroplasty on 07/18/2023.  Patient is doing well and pain is overall controlled.  Using CPM machine as intended.  Denies any chest pain, SOB, fevers, chills.  No complaint of any instability symptoms.  Has been able to discontinue oxycodone on and just take Tylenol primarily.  She does report that her nerve block never really took effect and she was in fairly intense pain after waking up from surgery.  She also developed constipation that has now mostly resolved but this was a painful experience for her as well.  Additionally, she developed a lot of lower extremity edema and has discussed this with her cardiologist who advised her to increase her Lasix dosage which has significantly helped with a lot of of the leg pain she was having but she still has some swelling that persists.  On exam, patient has range of motion 25 degrees X rotation, 60 degrees abduction, 90 degrees forward elevation passively.  Intact EPL, FPL, finger abduction, finger adduction, pronation/supination, bicep, tricep, deltoid of operative extremity.  Axillary nerve intact with deltoid firing.  Incision is healing well without evidence of infection or dehiscence.  Incision was made sure to be covered with Steri-Strips from the proximal to distal aspect of the length of the incision.  2+ radial pulse of the operative extremity  Plan is discontinue sling.  Okay to very lightly lift with the operative extremity but no lifting anything heavier than a  coffee cup or cell phone.  Start physical therapy to focus on passive range of motion and active range of motion with deltoid isometrics.  Do not want to externally rotate past 30 degrees to protect subscapularis repair.  All of these restrictions were detailed in the physical therapy prescription and she will do this in [place].  Prescribed Tylenol #3 for pain control.  Follow-Up Instructions: No follow-ups on file.   Orders:  Orders Placed This Encounter  Procedures   XR Shoulder Right   No orders of the defined types were placed in this encounter.   Imaging: No results found.  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  Past Surgical History:  Procedure Laterality Date   ABDOMINAL HYSTERECTOMY     1989   ARCUATE KERATECTOMY     BICEPT TENODESIS Right 07/18/2023   Procedure: BICEPS TENODESIS;  Surgeon: Cammy Copa, MD;  Location: [facility];  Service: Orthopedics;  Laterality: Right;   BIOPSY  03/21/2023   Procedure: BIOPSY;  Surgeon: Lanelle Bal, DO;  Location: [facility];  Service: Gastroenterology;;   BREAST BIOPSY     BIL   CARDIAC CATHETERIZATION     8 YRS AGO/ STRESS ECHO 8 YRS AGO   CARPAL TUNNEL RELEASE     BILATERAL   CHOLECYSTECTOMY      COLONOSCOPY WITH PROPOFOL N/A 03/21/2023   Procedure: COLONOSCOPY WITH PROPOFOL;  Surgeon: Lanelle Bal, DO;  Location: [facility];  Service: Gastroenterology;  Laterality: N/A;  1:15pm; ASA 3   CYSTOSCOPY     YEARLY   ESOPHAGOGASTRODUODENOSCOPY (EGD) WITH PROPOFOL N/A 03/21/2023   Procedure: ESOPHAGOGASTRODUODENOSCOPY (EGD) WITH PROPOFOL;  Surgeon: Lanelle Bal, DO;  Location: [facility];  Service: Gastroenterology;  Laterality: N/A;  1:15 pm;ASA 3   EYE SURGERY     HEMORROIDECTOMY     INGUINAL HERNIA REPAIR     JOINT REPLACEMENT     RT TOTAL KNEE 2011   KNEE ARTHROPLASTY  10/05/2011   Procedure: COMPUTER ASSISTED TOTAL KNEE ARTHROPLASTY;  Surgeon: Kathryne Hitch;  Location: [facility];  Service: Orthopedics;  Laterality: Left;  preop femoral nerve block   NONE  09/26/2011   NO PREVIOUS SURG   NONE     POLYPECTOMY  03/21/2023   Procedure: POLYPECTOMY;  Surgeon: Lanelle Bal, DO;  Location: [facility];  Service: Gastroenterology;;   REVERSE SHOULDER ARTHROPLASTY Right 07/18/2023   Procedure: RIGHT REVERSE SHOULDER ARTHROPLASTY;  Surgeon: Cammy Copa, MD;  Location: [facility];  Service: Orthopedics;  Laterality: Right;   ULNAR NERVE REPAIR     BIL   WRIST ARTHROSCOPY     BIL   Social History   Occupational History   Not on file  Tobacco Use   Smoking status: Never    Passive exposure: Past   Smokeless tobacco: Never  Substance and Sexual Activity   Alcohol use: No   Drug use: No   Sexual activity: Not on file

## 2023-08-04 ENCOUNTER — Encounter: Payer: Self-pay | Admitting: Surgical

## 2023-08-08 ENCOUNTER — Telehealth: Payer: Self-pay | Admitting: Surgical

## 2023-08-08 ENCOUNTER — Other Ambulatory Visit: Payer: Self-pay | Admitting: Surgical

## 2023-08-08 MED ORDER — OXYCODONE HCL 5 MG PO TABS
5.0000 mg | ORAL_TABLET | Freq: Three times a day (TID) | ORAL | 0 refills | Status: DC | PRN
Start: 2023-08-08 — End: 2023-08-30

## 2023-08-08 NOTE — Telephone Encounter (Signed)
Tried calling pt to advise. No answer and no voice mail

## 2023-08-08 NOTE — Telephone Encounter (Signed)
Pt was advised and stated understanding  

## 2023-08-08 NOTE — Telephone Encounter (Signed)
Sent in refill of oxy. Do not take with Tylenol#3, she should discontinue that since it seems that isnt as helpful and dont want to double up on opioid med

## 2023-08-08 NOTE — Telephone Encounter (Signed)
Pt called stating she needs a refill on oxy for her right shoulder/arm pain. Pt advised to give her a call back please.  [name] [phone]

## 2023-08-15 ENCOUNTER — Telehealth: Payer: Self-pay | Admitting: Orthopedic Surgery

## 2023-08-15 NOTE — Telephone Encounter (Signed)
Pt called requesting a script for TENS Unit for her shoulder. Pt had surgery 8/22 on right shoulder. Please call pt about this matter at [phone].

## 2023-08-15 NOTE — Telephone Encounter (Signed)
Yeah okay for this.  I'd avoid putting the TENS unit on the anterior aspect of the shoulder though, only stay on the outside of the incision

## 2023-08-27 ENCOUNTER — Other Ambulatory Visit: Payer: Self-pay | Admitting: Orthopedic Surgery

## 2023-08-30 ENCOUNTER — Other Ambulatory Visit (INDEPENDENT_AMBULATORY_CARE_PROVIDER_SITE_OTHER): Payer: Medicare Other

## 2023-08-30 ENCOUNTER — Encounter: Payer: Self-pay | Admitting: Surgical

## 2023-08-30 ENCOUNTER — Ambulatory Visit (INDEPENDENT_AMBULATORY_CARE_PROVIDER_SITE_OTHER): Payer: Medicare Other | Admitting: Surgical

## 2023-08-30 DIAGNOSIS — Z96611 Presence of right artificial shoulder joint: Secondary | ICD-10-CM

## 2023-08-30 DIAGNOSIS — M542 Cervicalgia: Secondary | ICD-10-CM | POA: Diagnosis not present

## 2023-08-30 MED ORDER — HYDROCODONE-ACETAMINOPHEN 5-325 MG PO TABS
1.0000 | ORAL_TABLET | Freq: Two times a day (BID) | ORAL | 0 refills | Status: AC | PRN
Start: 2023-08-30 — End: ?

## 2023-08-30 NOTE — Progress Notes (Signed)
Post-Op Visit Note   [name]           Date of Birth: [birth year]           MRN: [id number] Visit Date: 08/30/2023 PCP: Valla Leaver, MD   Assessment & Plan:  Chief Complaint:  Chief Complaint  Patient presents with   Right Shoulder - Follow-up    Right shoulder arthroplasty 07/18/2023   Visit Diagnoses:  1. Neck pain     Plan: Patient is a 77 year old female who presents s/p right reverse shoulder arthroplasty on 07/18/2023.  She is overall doing better and trending better in regards to her right shoulder pain.  She has physical therapy 2 times a week and this is progressing well.  She feels that her range of motion is improving.  She does describe a constant aching pain in the entire arm down to her fingers.  She will have occasional trapezius pain in her neck at nighttime and does have occasional scapular pain as well.  She notes numbness and tingling around the elbow though this is typical for her given her prior history of carpal tunnel surgery with recurrent carpal tunnel syndrome and prior ulnar nerve release.  She has never had MRI of the C-spine or C-spine ESI or surgery.  On exam, patient has 50 degrees external rotation, 80 degrees abduction, 1 to 5 degrees forward elevation passively.  She has incision that is healing well without evidence of infection or dehiscence.  2+ radial pulse of the operative extremity.  Intact EPL, FPL, finger abduction, grip strength testing, pronation/supination, bicep, tricep, deltoid.  Axillary nerve is intact with deltoid firing.  She has tenderness over the axial cervical spine at the base of the C-spine.  Negative Spurling sign.  Negative Lhermitte sign.  Plan is order MRI of the cervical spine to evaluate right radiculopathy.  She has had fairly constant pain in the entirety of the right arm that has been going on in addition to her shoulder pain for about several months to a year.  She has had physical therapy for this  problem and home exercises as well as medication trials without any relief.  With the constant nature of this pain in the fact that it radiates down the entire arm and is associated with scapular pain and some tenderness in the axial cervical spine, there is concern for right cervical radiculopathy.  Follow-up after MRI to review results and discuss next steps.  Could also consider right wrist radiographs and possible wrist injection given the presence of some "crunching" sensation in her right wrist and pain localizing to this area.  Follow-Up Instructions: No follow-ups on file.   Orders:  Orders Placed This Encounter  Procedures   XR Cervical Spine 2 or 3 views   Meds ordered this encounter  Medications   HYDROcodone-acetaminophen (NORCO/VICODIN) 5-325 MG tablet    Sig: Take 1 tablet by mouth every 12 (twelve) hours as needed for moderate pain. Do not take with oxycodone    Dispense:  30 tablet    Refill:  0    Imaging: No results found.  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  Past Surgical History:  Procedure Laterality Date   ABDOMINAL HYSTERECTOMY     1989   ARCUATE KERATECTOMY     BICEPT TENODESIS Right 07/18/2023   Procedure: BICEPS TENODESIS;   Surgeon: Cammy Copa, MD;  Location: [facility];  Service: Orthopedics;  Laterality: Right;   BIOPSY  03/21/2023   Procedure: BIOPSY;  Surgeon: Lanelle Bal, DO;  [facility];  Service: Gastroenterology;;   BREAST BIOPSY     BIL   CARDIAC CATHETERIZATION     8 YRS AGO/ STRESS ECHO 8 YRS AGO   CARPAL TUNNEL RELEASE     BILATERAL   CHOLECYSTECTOMY     COLONOSCOPY WITH PROPOFOL N/A 03/21/2023   Procedure: COLONOSCOPY WITH PROPOFOL;  Surgeon: Lanelle Bal, DO;  [facility];  Service: Gastroenterology;  Laterality: N/A;  1:15pm; ASA 3   CYSTOSCOPY     YEARLY   ESOPHAGOGASTRODUODENOSCOPY (EGD) WITH PROPOFOL N/A 03/21/2023   Procedure: ESOPHAGOGASTRODUODENOSCOPY (EGD) WITH PROPOFOL;  Surgeon: Lanelle Bal, DO;  [facility];  Service: Gastroenterology;  Laterality: N/A;  1:15 pm;ASA 3   EYE SURGERY     HEMORROIDECTOMY     INGUINAL HERNIA REPAIR     JOINT REPLACEMENT     RT TOTAL KNEE 2011   KNEE ARTHROPLASTY  10/05/2011   Procedure: COMPUTER ASSISTED TOTAL KNEE ARTHROPLASTY;  Surgeon: Kathryne Hitch;  Location: [facility];  Service: Orthopedics;  Laterality: Left;  preop femoral nerve block   NONE  09/26/2011   NO PREVIOUS SURG   NONE     POLYPECTOMY  03/21/2023   Procedure: POLYPECTOMY;  Surgeon: Lanelle Bal, DO;  [facility];  Service: Gastroenterology;;   REVERSE SHOULDER ARTHROPLASTY Right 07/18/2023   Procedure: RIGHT REVERSE SHOULDER ARTHROPLASTY;  Surgeon: Cammy Copa, MD;  Location: [facility];  Service: Orthopedics;  Laterality: Right;   ULNAR NERVE REPAIR     BIL   WRIST ARTHROSCOPY     BIL   Social History   Occupational History   Not on file  Tobacco Use   Smoking status: Never    Passive exposure: Past   Smokeless tobacco: Never  Substance and Sexual Activity   Alcohol use: No   Drug use: No   Sexual activity: Not on file

## 2023-09-12 ENCOUNTER — Telehealth: Payer: Self-pay | Admitting: Surgical

## 2023-09-12 NOTE — Telephone Encounter (Signed)
Called pt to try an schedule MRI review with Franky Macho could not leave VM please advise when pt calls back

## 2023-09-28 ENCOUNTER — Ambulatory Visit
Admission: RE | Admit: 2023-09-28 | Discharge: 2023-09-28 | Disposition: A | Discharge status: Home / Self Care | Payer: Medicare Other | Source: Ambulatory Visit | Attending: Surgical | Admitting: Surgical

## 2023-09-28 DIAGNOSIS — M542 Cervicalgia: Secondary | ICD-10-CM

## 2023-10-09 ENCOUNTER — Ambulatory Visit (INDEPENDENT_AMBULATORY_CARE_PROVIDER_SITE_OTHER): Payer: Medicare Other | Admitting: Orthopedic Surgery

## 2023-10-09 DIAGNOSIS — Z96611 Presence of right artificial shoulder joint: Secondary | ICD-10-CM

## 2023-10-12 ENCOUNTER — Encounter: Payer: Self-pay | Admitting: Orthopedic Surgery

## 2023-10-12 NOTE — Progress Notes (Unsigned)
Post-Op Visit Note   [name]           Date of Birth: Feb 03, 1946           MRN: [id number] Visit Date: 10/09/2023 PCP: Valla Leaver, MD   Assessment & Plan:  Chief Complaint:  Chief Complaint  Patient presents with   Other     Review MRI    Visit Diagnoses: No diagnosis found.  Plan: [name] is a 77 year old patient who underwent reverse shoulder replacement 07/18/2023.  Having some anterior pain.  Since he was last seen she has had an MRI of her cervical spine.  This does show small disc bulges at C4-5 resulting in moderate bilateral neuroforaminal narrowing.  There is moderate right neuroforaminal narrowing at C6-7.  She is in physical therapy 2 times a week.  Using heat ice and massage.  Does home exercise program also.  On examination she has improving range of motion and strength.  At this time would like for her to continue therapy 2 times a week for 6 more weeks for strengthening and range of motion exercises.  Follow-up in 2 months with [facility] with decision for or against cervical spine ESI at that time.  Follow-Up Instructions: Return in about 2 months (around 12/09/2023).   Orders:  No orders of the defined types were placed in this encounter.  No orders of the defined types were placed in this encounter.   Imaging: No results found.  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  Past Surgical History:  Procedure Laterality Date   ABDOMINAL HYSTERECTOMY     1989   ARCUATE KERATECTOMY     BICEPT TENODESIS Right 07/18/2023   Procedure: BICEPS TENODESIS;  Surgeon: Cammy Copa, MD;  Location: [facility];  Service: Orthopedics;  Laterality: Right;   BIOPSY  03/21/2023   Procedure: BIOPSY;  Surgeon: Lanelle Bal, DO;  [facility];  Service: Gastroenterology;;   BREAST BIOPSY     BIL   CARDIAC CATHETERIZATION     8 YRS AGO/ STRESS ECHO 8 YRS AGO   CARPAL TUNNEL RELEASE     BILATERAL   CHOLECYSTECTOMY     COLONOSCOPY WITH PROPOFOL N/A 03/21/2023   Procedure: COLONOSCOPY WITH PROPOFOL;  Surgeon: Lanelle Bal, DO;  [facility];  Service: Gastroenterology;  Laterality: N/A;  1:15pm; ASA 3   CYSTOSCOPY     YEARLY   ESOPHAGOGASTRODUODENOSCOPY (EGD) WITH PROPOFOL N/A 03/21/2023   Procedure: ESOPHAGOGASTRODUODENOSCOPY (EGD) WITH PROPOFOL;  Surgeon: Lanelle Bal, DO;  [facility];  Service: Gastroenterology;  Laterality: N/A;  1:15 pm;ASA 3   EYE SURGERY     HEMORROIDECTOMY  INGUINAL HERNIA REPAIR     JOINT REPLACEMENT     RT TOTAL KNEE 2011   KNEE ARTHROPLASTY  10/05/2011   Procedure: COMPUTER ASSISTED TOTAL KNEE ARTHROPLASTY;  Surgeon: Kathryne Hitch;  Location: [facility];  Service: Orthopedics;  Laterality: Left;  preop femoral nerve block   NONE  09/26/2011   NO PREVIOUS SURG   NONE     POLYPECTOMY  03/21/2023   Procedure: POLYPECTOMY;  Surgeon: Lanelle Bal, DO;  [facility];  Service: Gastroenterology;;   REVERSE SHOULDER ARTHROPLASTY Right 07/18/2023   Procedure: RIGHT REVERSE SHOULDER ARTHROPLASTY;  Surgeon: Cammy Copa, MD;  [facility];  Service: Orthopedics;  Laterality: Right;   ULNAR NERVE REPAIR     BIL    WRIST ARTHROSCOPY     BIL   Social History   Occupational History   Not on file  Tobacco Use   Smoking status: Never    Passive exposure: Past   Smokeless tobacco: Never  Substance and Sexual Activity   Alcohol use: No   Drug use: No   Sexual activity: Not on file

## 2023-10-13 ENCOUNTER — Encounter: Payer: Self-pay | Admitting: Orthopedic Surgery

## 2023-10-14 ENCOUNTER — Telehealth: Payer: Self-pay

## 2023-10-14 NOTE — Telephone Encounter (Signed)
Script given to patient at her OV to provide to the therapist she sees that is closer to her home in [place].

## 2023-10-14 NOTE — Telephone Encounter (Signed)
-----   Message from Burnard Bunting sent at 10/13/2023  9:59 AM EST ----- Hi Avenir Lozinski can you continue her physical therapy 2 times a week for 6 more weeks for right shoulder range of motion and strengthening following reverse shoulder replacement.  Thanks

## 2023-10-15 NOTE — Progress Notes (Deleted)
GI Office Note    Referring Provider: Gennie Alma* Primary Care Physician:  Valla Leaver, MD Primary Gastroenterologist: Hennie Duos. Marletta Lor, DO  Date:  10/15/2023  ID:  [name], DOB 18-Jul-1946, MRN [id number]   Chief Complaint   No chief complaint on file.   History of Present Illness  Lindsey Williams is a 77 y.o. female with a history of HTN, arthritis, diabetes, CKD, bladder cancer, GERD, and constipation presenting today with complaint of **  Appears patient had a colonoscopy/sigmoidoscopy in 2007 at [facility] but report not available.    Per referral notes patient ports cough and epigastric discomfort at times as well as a burning sensation in her throat and her stomach for several years that is worse with spicy and greasy foods.  Also with morning nausea and a lot of belching.  Reportedly was taking Protonix 40 mg twice daily for years which did not seem to help.  Also having heart palpitations at times and felt that was causing her burning pain in her chest.  No reports an EGD and colonoscopy done in May 2019 for similar complaints and small hiatal hernia noted at that time by Dr. Cristy Folks.  PCP had advised for her to start taking Prilosec 40 mg twice daily for couple weeks and adding Carafate and obtain upper GI series.  Advised to consider EGD pending these results.    UGI series October 2023 with frequent uncoordinated tertiary esophageal contractions.    Initial consultation here 02/19/2023.  Patient reported ongoing chest pain/palpitations with evaluations by cardiology who continues to report everything is normal and that this is secondary to GI issue.  Has had previous upper endoscopies noting hiatal hernia.  Her palpitations continued despite Protonix in the past with her prior GI.  Had new cardiac evaluation who performed the previous evaluation as her prior cardiologist including echo, stress test, cardiac cath, and CTA.  Was unable to tolerate  Lipitor or Crestor.  Has tried multiple different medications including nadolol and carvedilol which she reports has made her heart rate increase.  She came to the office at the recommendation of cardiology given his so they feel like this is reflux related and she reported multiple prior upper GI series in the past without reflux or hernia and her palpitations are most notable at night and she feels as though it is her heart that is a problem.  Has been taking Prilosec without issue.  Denied any nausea, vomiting, or dysphagia.  Reportedly had gained 10 pounds since being without her Ozempic.  Does have some mild diarrhea/urgency with roughage which she tries to avoid.  Noted to be due for colonoscopy in May.  To further evaluate her symptoms she was scheduled for an EGD as well as a colonoscopy given she was pretty much due for surveillance anyway.  She was advised to continue omeprazole 40 mg once daily and continue follow-up with PCP and cardiology.   EGD 03/21/2023: -Gastritis s/p biopsy -Single gastric polyp s/p removal and clip placement -Normal duodenum -Advised use PPI twice daily -Pathology with focal intestinal metaplasia, inactive gastritis and reactive chemical gastropathy, H. pylori negative.  Polyp removed from gallbladder was hyperplastic   Colonoscopy 03/21/2023: -Sigmoid diverticulosis -(2) 5 to 7 mm polyps in the transverse colon and cecum -Two 4-6 mm polyps in the sigmoid and descending colon -Pathology with 1 fragment of tubular adenoma and a hyperplastic polyp from cecal and transverse polypectomy -Detached fragment of gastric, transitional type mucosa with chronic gastritis present, negative  for H. pylori, 1 tubular adenoma and 2 fragments of hyperplastic and polypoid lymphoid aggregate present from descending and sigmoid polyps without dysplasia -Advised repeat colonoscopy in 5 years.  Last office visit 05/13/23.***. Advise continue omeprazole 40 mg once daily can consider  increasing to twice daily if needed help with nausea and burning if needed.  Avoid NSAIDs and caffeine.  Continue to follow with cardiology for palpitations.  Follow GERD diet.  Follow-up in 6 months.   Today:    Wt Readings from Last 3 Encounters:  07/18/23 238 lb (108 kg)  07/08/23 238 lb (108 kg)  05/13/23 226 lb 12.8 oz (102.9 kg)    Current Outpatient Medications  Medication Sig Dispense Refill   albuterol (VENTOLIN HFA) 108 (90 Base) MCG/ACT inhaler Inhale 2 puffs into the lungs every 6 (six) hours as needed for shortness of breath.     ALPRAZolam (XANAX) 0.5 MG tablet Take 0.5 mg by mouth 2 (two) times daily as needed for anxiety.     aspirin EC 81 MG tablet Take 1 tablet (81 mg total) by mouth daily. Swallow whole. 30 tablet 12   BREYNA 160-4.5 MCG/ACT inhaler Inhale 2 puffs into the lungs in the morning and at bedtime.     calcium-vitamin D (OSCAL WITH D) 500-200 MG-UNIT per tablet Take 0.5 tablets by mouth daily.       Cholecalciferol (D-3-5) 5000 units capsule Take 5,000 Units by mouth daily.     docusate sodium (COLACE) 100 MG capsule Take 1 capsule (100 mg total) by mouth 2 (two) times daily. 10 capsule 0   furosemide (LASIX) 40 MG tablet Take 40 mg by mouth in the morning.     HYDROcodone-acetaminophen (NORCO/VICODIN) 5-325 MG tablet Take 1 tablet by mouth every 12 (twelve) hours as needed for moderate pain. Do not take with oxycodone 30 tablet 0   metoprolol tartrate (LOPRESSOR) 50 MG tablet Take 100 mg by mouth in the morning and at bedtime.     MOUNJARO 5 MG/0.5ML Pen Inject 5 mg into the skin once a week.     omeprazole (PRILOSEC) 40 MG capsule Take 1 capsule (40 mg total) by mouth 2 (two) times daily. 90 capsule 3   ondansetron (ZOFRAN) 4 MG tablet Take 1 tablet (4 mg total) by mouth every 8 (eight) hours as needed for nausea or vomiting. 20 tablet 0   potassium chloride SA (KLOR-CON M) 20 MEQ tablet Take 20 mEq by mouth daily.     tiZANidine (ZANAFLEX) 2 MG tablet  TAKE 1 TABLET(2 MG) BY MOUTH EVERY 8 HOURS AS NEEDED FOR MUSCLE SPASMS 30 tablet 0   No current facility-administered medications for this visit.    Past Medical History:  Diagnosis Date   Cancer (HCC)    bladder ca in 1989   COPD (chronic obstructive pulmonary disease) (HCC)    Diabetes mellitus    GERD (gastroesophageal reflux disease)    Hypertension    Nausea & vomiting    Osteoarthritis of left knee 10/07/2011   Pneumonia    HX PE 1972 SUPERFICIAL PHLEBITIS 2010   PVC's (premature ventricular contractions)     Past Surgical History:  Procedure Laterality Date   ABDOMINAL HYSTERECTOMY     1989   ARCUATE KERATECTOMY     BICEPT TENODESIS Right 07/18/2023   Procedure: BICEPS TENODESIS;  Surgeon: Cammy Copa, MD;  [facility]: [facility];  Service: Orthopedics;  Laterality: Right;   BIOPSY  03/21/2023   Procedure: BIOPSY;  Surgeon: Earnest Bailey  K, DO;  [facility];  Service: Gastroenterology;;   BREAST BIOPSY     BIL   CARDIAC CATHETERIZATION     8 YRS AGO/ STRESS ECHO 8 YRS AGO   CARPAL TUNNEL RELEASE     BILATERAL   CHOLECYSTECTOMY     COLONOSCOPY WITH PROPOFOL N/A 03/21/2023   Procedure: COLONOSCOPY WITH PROPOFOL;  Surgeon: Lanelle Bal, DO;  [facility];  Service: Gastroenterology;  Laterality: N/A;  1:15pm; ASA 3   CYSTOSCOPY     YEARLY   ESOPHAGOGASTRODUODENOSCOPY (EGD) WITH PROPOFOL N/A 03/21/2023   Procedure: ESOPHAGOGASTRODUODENOSCOPY (EGD) WITH PROPOFOL;  Surgeon: Lanelle Bal, DO;  [facility];  Service: Gastroenterology;  Laterality: N/A;  1:15 pm;ASA 3   EYE SURGERY     HEMORROIDECTOMY     INGUINAL HERNIA REPAIR     JOINT REPLACEMENT     RT TOTAL KNEE 2011   KNEE ARTHROPLASTY  10/05/2011   Procedure: COMPUTER ASSISTED TOTAL KNEE ARTHROPLASTY;  Surgeon: Kathryne Hitch;  Location: [facility];  Service: Orthopedics;  Laterality: Left;  preop femoral nerve block   NONE  09/26/2011   NO PREVIOUS SURG    NONE     POLYPECTOMY  03/21/2023   Procedure: POLYPECTOMY;  Surgeon: Lanelle Bal, DO;  [facility];  Service: Gastroenterology;;   REVERSE SHOULDER ARTHROPLASTY Right 07/18/2023   Procedure: RIGHT REVERSE SHOULDER ARTHROPLASTY;  Surgeon: Cammy Copa, MD;  [facility];  Service: Orthopedics;  Laterality: Right;   ULNAR NERVE REPAIR     BIL   WRIST ARTHROSCOPY     BIL    No family history on file.  Allergies as of 10/16/2023 - Review Complete 10/13/2023  Allergen Reaction Noted   Erythromycin Shortness Of Breath and Other (See Comments) 09/24/2011   Tape  10/05/2011   Epinephrine  03/04/2017   Losartan Cough 10/09/2019   Other Nausea And Vomiting 01/28/2017    Social History   Socioeconomic History   Marital status: Single    Spouse name: Not on file   Number of children: Not on file   Years of education: Not on file   Highest education level: Not on file  Occupational History   Not on file  Tobacco Use   Smoking status: Never    Passive exposure: Past   Smokeless tobacco: Never  Substance and Sexual Activity   Alcohol use: No   Drug use: No   Sexual activity: Not on file  Other Topics Concern   Not on file  Social History Narrative   Not on file   Social Determinants of Health   Financial Resource Strain: Not on file  Food Insecurity: No Food Insecurity (07/18/2023)   Hunger Vital Sign    Worried About Running Out of Food in the Last Year: Never true    Ran Out of Food in the Last Year: Never true  Transportation Needs: No Transportation Needs (07/18/2023)   PRAPARE - Administrator, Civil Service (Medical): No    Lack of Transportation (Non-Medical): No  Physical Activity: Not on file  Stress: Not on file  Social Connections: Not on file     Review of Systems   Gen: Denies fever, chills, anorexia. Denies fatigue, weakness, weight loss.  CV: Denies chest pain, palpitations, syncope, peripheral edema, and  claudication. Resp: Denies dyspnea at rest, cough, wheezing, coughing up blood, and pleurisy. GI: See HPI Derm: Denies rash, itching, dry skin Psych: Denies  depression, anxiety, memory loss, confusion. No homicidal or suicidal ideation.  Heme: Denies bruising, bleeding, and enlarged lymph nodes.  Physical Exam   There were no vitals taken for this visit.  General:   Alert and oriented. No distress noted. Pleasant and cooperative.  Head:  Normocephalic and atraumatic. Eyes:  Conjuctiva clear without scleral icterus. Mouth:  Oral mucosa pink and moist. Good dentition. No lesions. Lungs:  Clear to auscultation bilaterally. No wheezes, rales, or rhonchi. No distress.  Heart:  S1, S2 present without murmurs appreciated.  Abdomen:  +BS, soft, non-tender and non-distended. No rebound or guarding. No HSM or masses noted. Rectal: *** Msk:  Symmetrical without gross deformities. Normal posture. Extremities:  Without edema. Neurologic:  Alert and  oriented x4 Psych:  Alert and cooperative. Normal mood and affect.  Assessment  Sanskriti Kadel is a 77 y.o. female with a history of  HTN, arthritis, diabetes, CKD, bladder cancer, GERD, and constipation presenting today with ***  GERD, palpitations:   PLAN   ***     Brooke Bonito, MSN, FNP-BC, AGACNP-BC [facility]

## 2023-10-16 ENCOUNTER — Ambulatory Visit: Payer: Medicare Other | Admitting: Gastroenterology

## 2023-11-13 ENCOUNTER — Ambulatory Visit: Payer: PRIVATE HEALTH INSURANCE | Admitting: Gastroenterology

## 2023-11-25 ENCOUNTER — Other Ambulatory Visit: Payer: Self-pay | Admitting: Gastroenterology

## 2023-12-05 ENCOUNTER — Telehealth: Payer: Self-pay | Admitting: Orthopedic Surgery

## 2023-12-05 NOTE — Telephone Encounter (Signed)
 I have called and asked them to refax the notes for Dr August Saucer to review.

## 2023-12-05 NOTE — Telephone Encounter (Signed)
 Patient called. Says the therapist at [facility] in [place] sent process notes for Dr. August Saucer to sign and it hasn't been done. She can not go to PT until Dr. August Saucer signs off on the orders.   . Their number is [phone]

## 2024-02-05 ENCOUNTER — Encounter: Payer: Self-pay | Admitting: Surgical

## 2024-02-05 ENCOUNTER — Ambulatory Visit (INDEPENDENT_AMBULATORY_CARE_PROVIDER_SITE_OTHER): Admitting: Surgical

## 2024-02-05 DIAGNOSIS — Z96611 Presence of right artificial shoulder joint: Secondary | ICD-10-CM

## 2024-02-05 NOTE — Progress Notes (Signed)
 Post-Op Visit Note   [name]           Date of Birth: 02/04/1946           MRN: [id number] Visit Date: 02/05/2024 PCP: Valla Leaver, MD   Assessment & Plan:  Chief Complaint:  Chief Complaint  Patient presents with   Right Shoulder - Follow-up   Visit Diagnoses: No diagnosis found.  Plan: Patient is a 78 year old female who presents for reevaluation following right reverse shoulder arthroplasty on 07/18/2023.  She states that her shoulder seems to be progressively doing better and better.  She is still in with physical therapy and she does feel like in terms of range of motion she has somewhat plateaued in the last month.  She has difficulty primarily with internal rotation behind her back but otherwise she feels very functional with her arm.  She sleeps well at night.  Does not really have to take any pain medication for her shoulder.  Neck pain is not bothering her and she has no radicular symptoms at this time.  On exam, patient has 50 degrees X rotation, 90 degrees abduction, 150 degrees or elevation passively and actively.  She has incision that is well-healed without evidence of infection or dehiscence.  Extra nerve intact with deltoid firing.  Excellent subscapularis strength rated 5/5.  2+ radial pulse of the operative extremity.  Intact EPL, FPL, finger abduction.  Plan is to continue with physical therapy versus home exercise program.  Think that with her extensive experience with PT over the last several months, she can transition to home exercise program at her own convenience.  Follow-up with the office as needed if she notices shoulder pain in the future.  If she does have recurrent shoulder pain, we would have to consider the possibility of her neck contributing to such pain.  Follow-Up Instructions: No follow-ups on file.   Orders:  No orders of the defined types were placed in this encounter.  No orders of the defined types were placed in this  encounter.   Imaging: No results found.  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  Past Surgical History:  Procedure Laterality Date   ABDOMINAL HYSTERECTOMY     1989   ARCUATE KERATECTOMY     BICEPT TENODESIS Right 07/18/2023   Procedure: BICEPS TENODESIS;  Surgeon: Cammy Copa, MD;  Location: [facility];  Service: Orthopedics;  Laterality: Right;   BIOPSY  03/21/2023   Procedure: BIOPSY;  Surgeon: Lanelle Bal, DO;  Location: [facility];  Service: Gastroenterology;;   BREAST BIOPSY     BIL   CARDIAC CATHETERIZATION     8 YRS AGO/ STRESS ECHO 8 YRS  AGO   CARPAL TUNNEL RELEASE     BILATERAL   CHOLECYSTECTOMY     COLONOSCOPY WITH PROPOFOL N/A 03/21/2023   Procedure: COLONOSCOPY WITH PROPOFOL;  Surgeon: Lanelle Bal, DO;  Location: [facility];  Service: Gastroenterology;  Laterality: N/A;  1:15pm; ASA 3   CYSTOSCOPY     YEARLY   ESOPHAGOGASTRODUODENOSCOPY (EGD) WITH PROPOFOL N/A 03/21/2023   Procedure: ESOPHAGOGASTRODUODENOSCOPY (EGD) WITH PROPOFOL;  Surgeon: Lanelle Bal, DO;  Location: [facility];  Service: Gastroenterology;  Laterality: N/A;  1:15 pm;ASA 3   EYE SURGERY     HEMORROIDECTOMY     INGUINAL HERNIA REPAIR     JOINT  REPLACEMENT     RT TOTAL KNEE 2011   KNEE ARTHROPLASTY  10/05/2011   Procedure: COMPUTER ASSISTED TOTAL KNEE ARTHROPLASTY;  Surgeon: Kathryne Hitch;  Location: [facility];  Service: Orthopedics;  Laterality: Left;  preop femoral nerve block   NONE  09/26/2011   NO PREVIOUS SURG   NONE     POLYPECTOMY  03/21/2023   Procedure: POLYPECTOMY;  Surgeon: Lanelle Bal, DO;  Location: [facility];  Service: Gastroenterology;;   REVERSE SHOULDER ARTHROPLASTY Right 07/18/2023   Procedure: RIGHT REVERSE SHOULDER ARTHROPLASTY;  Surgeon: Cammy Copa, MD;  Location: [facility];  Service: Orthopedics;  Laterality: Right;   ULNAR NERVE REPAIR     BIL   WRIST ARTHROSCOPY     BIL   Social History   Occupational History   Not on file  Tobacco Use   Smoking status: Never    Passive exposure: Past   Smokeless tobacco: Never  Substance and Sexual Activity   Alcohol use: No   Drug use: No   Sexual activity: Not on file

## 2024-02-19 ENCOUNTER — Ambulatory Visit: Admitting: Orthopaedic Surgery

## 2024-09-28 ENCOUNTER — Encounter: Payer: Self-pay | Admitting: Radiology

## 2024-11-29 ENCOUNTER — Other Ambulatory Visit: Payer: Self-pay | Admitting: Gastroenterology

## 2025-01-28 ENCOUNTER — Ambulatory Visit: Admitting: Gastroenterology
